# Patient Record
Sex: Male | Born: 1965 | Race: White | Hispanic: No | Marital: Married | Smoking: Former smoker
Health system: Southern US, Community
[De-identification: ages and names within clinical notes are randomized; demographics above are authoritative.]

## PROBLEM LIST (undated history)

## (undated) DIAGNOSIS — G629 Polyneuropathy, unspecified: Secondary | ICD-10-CM

## (undated) DIAGNOSIS — I10 Essential (primary) hypertension: Secondary | ICD-10-CM

## (undated) DIAGNOSIS — K859 Acute pancreatitis without necrosis or infection, unspecified: Secondary | ICD-10-CM

## (undated) DIAGNOSIS — I2510 Atherosclerotic heart disease of native coronary artery without angina pectoris: Secondary | ICD-10-CM

## (undated) DIAGNOSIS — E785 Hyperlipidemia, unspecified: Secondary | ICD-10-CM

## (undated) HISTORY — DX: Atherosclerotic heart disease of native coronary artery without angina pectoris: I25.10

## (undated) HISTORY — DX: Essential (primary) hypertension: I10

## (undated) HISTORY — DX: Hyperlipidemia, unspecified: E78.5

## (undated) HISTORY — DX: Polyneuropathy, unspecified: G62.9

---

## 1990-07-20 HISTORY — PX: LAMINECTOMY: SHX219

## 2000-12-22 ENCOUNTER — Encounter: Payer: Self-pay | Admitting: Emergency Medicine

## 2000-12-22 ENCOUNTER — Emergency Department (HOSPITAL_COMMUNITY): Admission: EM | Admit: 2000-12-22 | Discharge: 2000-12-23 | Payer: Self-pay | Admitting: Emergency Medicine

## 2004-03-25 ENCOUNTER — Emergency Department (HOSPITAL_COMMUNITY): Admission: EM | Admit: 2004-03-25 | Discharge: 2004-03-25 | Payer: Self-pay | Admitting: Family Medicine

## 2007-03-21 HISTORY — PX: CORONARY ARTERY BYPASS GRAFT: SHX141

## 2007-04-13 ENCOUNTER — Inpatient Hospital Stay (HOSPITAL_COMMUNITY): Admission: AD | Admit: 2007-04-13 | Discharge: 2007-04-19 | Payer: Self-pay | Admitting: Cardiovascular Disease

## 2007-04-13 ENCOUNTER — Ambulatory Visit: Payer: Self-pay | Admitting: Cardiovascular Disease

## 2007-04-13 ENCOUNTER — Encounter: Payer: Self-pay | Admitting: Emergency Medicine

## 2007-04-14 ENCOUNTER — Ambulatory Visit: Payer: Self-pay | Admitting: Thoracic Surgery (Cardiothoracic Vascular Surgery)

## 2007-04-27 ENCOUNTER — Inpatient Hospital Stay (HOSPITAL_COMMUNITY): Admission: EM | Admit: 2007-04-27 | Discharge: 2007-04-30 | Payer: Self-pay | Admitting: Emergency Medicine

## 2007-04-27 ENCOUNTER — Ambulatory Visit: Payer: Self-pay | Admitting: *Deleted

## 2007-05-09 ENCOUNTER — Ambulatory Visit: Payer: Self-pay | Admitting: Thoracic Surgery (Cardiothoracic Vascular Surgery)

## 2007-05-09 ENCOUNTER — Encounter
Admission: RE | Admit: 2007-05-09 | Discharge: 2007-05-09 | Payer: Self-pay | Admitting: Thoracic Surgery (Cardiothoracic Vascular Surgery)

## 2007-05-18 ENCOUNTER — Ambulatory Visit: Payer: Self-pay | Admitting: Cardiology

## 2007-05-20 ENCOUNTER — Ambulatory Visit: Payer: Self-pay | Admitting: Cardiology

## 2007-05-20 ENCOUNTER — Ambulatory Visit (HOSPITAL_COMMUNITY)
Admission: RE | Admit: 2007-05-20 | Discharge: 2007-05-20 | Payer: Self-pay | Admitting: Thoracic Surgery (Cardiothoracic Vascular Surgery)

## 2007-05-20 ENCOUNTER — Ambulatory Visit: Payer: Self-pay

## 2007-05-20 LAB — CONVERTED CEMR LAB
ALT: 30 U/L
AST: 21 U/L
Albumin: 4.2 g/dL
Alkaline Phosphatase: 85 U/L
BUN: 15 mg/dL
Bilirubin, Direct: 0.1 mg/dL
CO2: 29 meq/L
Calcium: 9.9 mg/dL
Chloride: 104 meq/L
Cholesterol: 145 mg/dL
Creatinine, Ser: 1.1 mg/dL
Direct LDL: 79.8 mg/dL
GFR calc Af Amer: 94 mL/min
GFR calc non Af Amer: 78 mL/min
Glucose, Bld: 107 mg/dL — ABNORMAL HIGH
HDL: 22.1 mg/dL — ABNORMAL LOW
Potassium: 4.6 meq/L
Sodium: 140 meq/L
Total Bilirubin: 0.5 mg/dL
Total CHOL/HDL Ratio: 6.6
Total Protein: 7.6 g/dL
Triglycerides: 334 mg/dL
VLDL: 67 mg/dL — ABNORMAL HIGH

## 2007-07-05 ENCOUNTER — Ambulatory Visit: Payer: Self-pay

## 2007-07-05 LAB — CONVERTED CEMR LAB
ALT: 24 units/L (ref 0–53)
Bilirubin, Direct: 0.1 mg/dL (ref 0.0–0.3)
Cholesterol: 154 mg/dL (ref 0–200)
HDL: 23.1 mg/dL — ABNORMAL LOW (ref >39.0)
Total CHOL/HDL Ratio: 6.7
Triglycerides: 282 mg/dL (ref 0–149)
VLDL: 56 mg/dL — ABNORMAL HIGH (ref 0–40)

## 2007-07-18 ENCOUNTER — Ambulatory Visit: Payer: Self-pay | Admitting: Cardiology

## 2007-08-07 ENCOUNTER — Emergency Department (HOSPITAL_COMMUNITY): Admission: EM | Admit: 2007-08-07 | Discharge: 2007-08-07 | Payer: Self-pay | Admitting: Family Medicine

## 2007-08-09 ENCOUNTER — Ambulatory Visit: Payer: Self-pay | Admitting: Cardiology

## 2007-08-16 ENCOUNTER — Ambulatory Visit: Payer: Self-pay | Admitting: Cardiology

## 2007-08-16 LAB — CONVERTED CEMR LAB
Bilirubin, Direct: 0.1 mg/dL (ref 0.0–0.3)
Total Bilirubin: 0.7 mg/dL (ref 0.3–1.2)

## 2007-08-18 LAB — CONVERTED CEMR LAB
ALT: 34 units/L (ref 0–53)
AST: 34 units/L (ref 0–37)
BUN: 14 mg/dL (ref 6–23)
Bilirubin, Direct: 0.1 mg/dL (ref 0.0–0.3)
Bilirubin, Direct: 0.1 mg/dL (ref 0.0–0.3)
CO2: 20 meq/L (ref 19–32)
Calcium: 9.9 mg/dL (ref 8.4–10.5)
Calcium: 9.9 mg/dL (ref 8.4–10.5)
Cholesterol: 133 mg/dL (ref 0–200)
Cholesterol: 133 mg/dL (ref 0–200)
Creatinine, Ser: 1.2 mg/dL (ref 0.4–1.5)
Direct LDL: 57.8 mg/dL
Direct LDL: 57.8 mg/dL
GFR calc Af Amer: 85 mL/min
HDL: 25 mg/dL — ABNORMAL LOW (ref >39.0)
HDL: 25 mg/dL — ABNORMAL LOW (ref >39.0)
Total Bilirubin: 0.7 mg/dL (ref 0.3–1.2)
Total Bilirubin: 0.7 mg/dL (ref 0.3–1.2)
Total CHOL/HDL Ratio: 5.3
Total Protein: 7.7 g/dL (ref 6.0–8.3)
Triglycerides: 337 mg/dL (ref 0–149)
Triglycerides: 337 mg/dL (ref 0–149)
VLDL: 67 mg/dL — ABNORMAL HIGH (ref 0–40)

## 2007-08-22 ENCOUNTER — Ambulatory Visit: Payer: Self-pay | Admitting: Internal Medicine

## 2007-09-28 ENCOUNTER — Ambulatory Visit: Payer: Self-pay | Admitting: Cardiology

## 2007-10-03 ENCOUNTER — Ambulatory Visit: Payer: Self-pay | Admitting: Internal Medicine

## 2007-10-03 LAB — CONVERTED CEMR LAB
ALT: 31 units/L (ref 0–53)
AST: 27 units/L (ref 0–37)
BUN: 14 mg/dL (ref 6–23)
Bilirubin, Direct: 0.1 mg/dL (ref 0.0–0.3)
Chloride: 107 meq/L (ref 96–112)
Creatinine, Ser: 1.2 mg/dL (ref 0.4–1.5)
Glucose, Bld: 110 mg/dL — ABNORMAL HIGH (ref 70–99)
HDL: 23.2 mg/dL — ABNORMAL LOW (ref >39.0)
Total Bilirubin: 0.5 mg/dL (ref 0.3–1.2)
Total CHOL/HDL Ratio: 5.5
Total Protein: 7.1 g/dL (ref 6.0–8.3)
Triglycerides: 247 mg/dL (ref 0–149)

## 2007-11-24 ENCOUNTER — Ambulatory Visit: Payer: Self-pay | Admitting: Internal Medicine

## 2007-11-24 LAB — CONVERTED CEMR LAB
Cholesterol: 117 mg/dL (ref 0–200)
HDL: 20.7 mg/dL — ABNORMAL LOW (ref >39.0)
LDL Cholesterol: 58 mg/dL (ref 0–99)
Total CHOL/HDL Ratio: 5.7

## 2007-12-01 ENCOUNTER — Ambulatory Visit: Payer: Self-pay | Admitting: Internal Medicine

## 2008-01-31 ENCOUNTER — Ambulatory Visit: Payer: Self-pay | Admitting: Cardiology

## 2008-01-31 LAB — CONVERTED CEMR LAB
AST: 24 units/L (ref 0–37)
Cholesterol: 120 mg/dL (ref 0–200)
Direct LDL: 61.8 mg/dL
HDL: 22.1 mg/dL — ABNORMAL LOW (ref >39.0)
Total Protein: 7.8 g/dL (ref 6.0–8.3)

## 2008-02-02 ENCOUNTER — Ambulatory Visit: Payer: Self-pay | Admitting: Cardiovascular Disease

## 2008-03-08 ENCOUNTER — Emergency Department (HOSPITAL_COMMUNITY): Admission: EM | Admit: 2008-03-08 | Discharge: 2008-03-08 | Payer: Self-pay | Admitting: Emergency Medicine

## 2008-04-05 ENCOUNTER — Ambulatory Visit: Payer: Self-pay | Admitting: Cardiology

## 2008-04-05 LAB — CONVERTED CEMR LAB
ALT: 26 units/L (ref 0–53)
AST: 23 units/L (ref 0–37)
Albumin: 4.1 g/dL (ref 3.5–5.2)
Alkaline Phosphatase: 82 units/L (ref 39–117)
Cholesterol: 198 mg/dL (ref 0–200)
Direct LDL: 134 mg/dL
Total CHOL/HDL Ratio: 6.7
Total Protein: 7.6 g/dL (ref 6.0–8.3)

## 2008-04-12 ENCOUNTER — Ambulatory Visit: Payer: Self-pay | Admitting: Cardiology

## 2008-04-18 ENCOUNTER — Ambulatory Visit: Payer: Self-pay | Admitting: Cardiology

## 2008-04-24 DIAGNOSIS — I10 Essential (primary) hypertension: Secondary | ICD-10-CM | POA: Insufficient documentation

## 2008-04-24 DIAGNOSIS — E785 Hyperlipidemia, unspecified: Secondary | ICD-10-CM

## 2008-04-24 DIAGNOSIS — I2510 Atherosclerotic heart disease of native coronary artery without angina pectoris: Secondary | ICD-10-CM | POA: Insufficient documentation

## 2008-07-24 ENCOUNTER — Ambulatory Visit: Payer: Self-pay | Admitting: Cardiology

## 2008-07-24 LAB — CONVERTED CEMR LAB
ALT: 29 units/L (ref 0–53)
AST: 25 units/L (ref 0–37)
Alkaline Phosphatase: 61 units/L (ref 39–117)
Bilirubin, Direct: 0.1 mg/dL (ref 0.0–0.3)
Cholesterol: 120 mg/dL (ref 0–200)
LDL Cholesterol: 60 mg/dL (ref 0–99)
Total CHOL/HDL Ratio: 4.8
Total Protein: 7.6 g/dL (ref 6.0–8.3)
Triglycerides: 177 mg/dL — ABNORMAL HIGH (ref 0–149)

## 2008-07-26 ENCOUNTER — Ambulatory Visit: Payer: Self-pay | Admitting: Cardiology

## 2008-10-31 ENCOUNTER — Ambulatory Visit: Payer: Self-pay | Admitting: Cardiology

## 2008-10-31 LAB — CONVERTED CEMR LAB
ALT: 36 units/L (ref 0–53)
Albumin: 4.6 g/dL (ref 3.5–5.2)
Alkaline Phosphatase: 78 units/L (ref 39–117)
Bilirubin, Direct: 0.4 mg/dL — ABNORMAL HIGH (ref 0.0–0.3)
HDL: 23.2 mg/dL — ABNORMAL LOW (ref >39.00)
VLDL: 26.4 mg/dL (ref 0.0–40.0)

## 2008-11-01 ENCOUNTER — Ambulatory Visit: Payer: Self-pay | Admitting: Internal Medicine

## 2008-11-02 ENCOUNTER — Encounter: Payer: Self-pay | Admitting: Cardiology

## 2008-12-20 ENCOUNTER — Ambulatory Visit: Payer: Self-pay | Admitting: Cardiology

## 2008-12-21 ENCOUNTER — Telehealth: Payer: Self-pay | Admitting: Cardiology

## 2008-12-21 LAB — CONVERTED CEMR LAB
Total Protein: 7.7 g/dL (ref 6.0–8.3)
VLDL: 46.8 mg/dL — ABNORMAL HIGH (ref 0.0–40.0)

## 2008-12-27 ENCOUNTER — Ambulatory Visit: Payer: Self-pay | Admitting: Cardiology

## 2009-02-05 ENCOUNTER — Encounter (INDEPENDENT_AMBULATORY_CARE_PROVIDER_SITE_OTHER): Payer: Self-pay | Admitting: *Deleted

## 2009-02-08 ENCOUNTER — Ambulatory Visit: Payer: Self-pay | Admitting: Cardiovascular Disease

## 2009-02-11 ENCOUNTER — Encounter (INDEPENDENT_AMBULATORY_CARE_PROVIDER_SITE_OTHER): Payer: Self-pay | Admitting: *Deleted

## 2009-02-11 LAB — CONVERTED CEMR LAB
ALT: 24 units/L (ref 0–53)
Bilirubin, Direct: 0 mg/dL (ref 0.0–0.3)
Direct LDL: 61.4 mg/dL
HDL: 24.5 mg/dL — ABNORMAL LOW (ref >39.00)
Total Bilirubin: 0.7 mg/dL (ref 0.3–1.2)
Total Protein: 7.6 g/dL (ref 6.0–8.3)

## 2009-09-12 ENCOUNTER — Ambulatory Visit: Payer: Self-pay | Admitting: Cardiology

## 2009-09-17 ENCOUNTER — Telehealth: Payer: Self-pay | Admitting: Cardiovascular Disease

## 2009-09-17 ENCOUNTER — Encounter (INDEPENDENT_AMBULATORY_CARE_PROVIDER_SITE_OTHER): Payer: Self-pay | Admitting: Cardiology

## 2009-09-17 LAB — CONVERTED CEMR LAB
ALT: 26 units/L (ref 0–53)
AST: 23 units/L (ref 0–37)
Alkaline Phosphatase: 61 units/L (ref 39–117)
BUN: 12 mg/dL (ref 6–23)
Bilirubin, Direct: 0 mg/dL (ref 0.0–0.3)
CO2: 27 meq/L (ref 19–32)
Calcium: 9.5 mg/dL (ref 8.4–10.5)
Chloride: 110 meq/L (ref 96–112)
Direct LDL: 108.8 mg/dL
Sodium: 142 meq/L (ref 135–145)
Total Bilirubin: 0.1 mg/dL — ABNORMAL LOW (ref 0.3–1.2)

## 2009-09-18 ENCOUNTER — Encounter: Payer: Self-pay | Admitting: Cardiology

## 2009-09-18 ENCOUNTER — Ambulatory Visit: Payer: Self-pay | Admitting: Cardiology

## 2009-10-18 ENCOUNTER — Ambulatory Visit: Payer: Self-pay | Admitting: Diagnostic Radiology

## 2009-10-18 ENCOUNTER — Emergency Department (HOSPITAL_BASED_OUTPATIENT_CLINIC_OR_DEPARTMENT_OTHER): Admission: EM | Admit: 2009-10-18 | Discharge: 2009-10-18 | Payer: Self-pay | Admitting: Emergency Medicine

## 2010-08-21 NOTE — Letter (Signed)
Summary: [facility] - Lipid  [facility], Main Office  [address]   [place], [place] [address]   Phone: [phone]  Fax: [phone]     September 17, 2009 MRN: [id number]   [name] [name] [address] [place], [place]  [address]   Dear [name],  We have reviewed your cholesterol results.  They are as follows:     Total Cholesterol:    200 (Desirable: less than 200)       HDL  Cholesterol:     30.60  (Desirable: greater than 40 for men and 50 for women)       LDL Cholesterol:       82  (Desirable: less than 100 for low risk and less than 70 for moderate to high risk)       Triglycerides:       551.0  (Desirable: less than 150)  Our recommendations include:   Call our office at the number listed above if you have any questions.  Lowering your LDL cholesterol is important, but it is only one of a large number of "risk factors" that may indicate that you are at risk for heart disease, stroke or other complications of hardening of the arteries.  Other risk factors include:   A.  Cigarette Smoking* B.  High Blood Pressure* C.  Obesity* D.   Low HDL Cholesterol (see yours above)* E.   Diabetes Mellitus (higher risk if your is uncontrolled) F.  Family history of premature heart disease G.  Previous history of stroke or cardiovascular disease    *These are risk factors YOU HAVE CONTROL OVER.  For more information, visit .  There is now evidence that lowering the TOTAL CHOLESTEROL AND LDL CHOLESTEROL can reduce the risk of heart disease.  The American Heart Association recommends the following guidelines for the treatment of elevated cholesterol:  1.  If there is now current heart disease and less than two risk factors, TOTAL CHOLESTEROL should be less than 200 and LDL CHOLESTEROL should be less than 100. 2.  If there is current heart disease or two or more risk factors, TOTAL CHOLESTEROL should be less than 200 and LDL CHOLESTEROL should be less than 70.  A diet  low in cholesterol, saturated fat, and calories is the cornerstone of treatment for elevated cholesterol.  Cessation of smoking and exercise are also important in the management of elevated cholesterol and preventing vascular disease.  Studies have shown that 30 to 60 minutes of physical activity most days can help lower blood pressure, lower cholesterol, and keep your weight at a healthy level.  Drug therapy is used when cholesterol levels do not respond to therapeutic lifestyle changes (smoking cessation, diet, and exercise) and remains unacceptably high.  If medication is started, it is important to have you levels checked periodically to evaluate the need for further treatment options.  Thank you,    [employer] Team

## 2010-08-21 NOTE — Assessment & Plan Note (Signed)
Summary: [facility]   Visit Type:  1 year follow up  CC:  Left foot pain.  History of Present Illness: Mr. Cauthon is a  gentleman who has a history of coronary artery disease status post coronary artery bypassing graft in September 2008.  In October 2008, he had recurrent chest pain and positive enzymes.  Her followup catheterization revealed that his saphenous vein graft to the diagonal was occluded.  I last saw him in September of 2009. Since then the patient denies any dyspnea on exertion, orthopnea, PND, pedal edema, palpitations, syncope or chest pain.   Current Medications (verified): 1)  Aspirin 81 Mg Tbec (Aspirin) .... Take One Tablet By Mouth Daily 2)  Gabapentin 300 Mg Caps (Gabapentin) .... Take 4 Capsules Two Times A Day 3)  Fish Oil 1000 Mg Caps (Omega-3 Fatty Acids) .... Take 1 Capsule Two Times A Day 4)  Multivitamins   Tabs (Multiple Vitamin) .... Take 1 Tablet Daily 5)  Garlic   Powd (Garlic) .... Take 1 Tablet Daily 6)  Lisinopril 5 Mg Tabs (Lisinopril) .... Take One Tablet By Mouth Daily 7)  Crestor 20 Mg Tabs (Rosuvastatin Calcium) .Marland Kitchen.. 1 Tab Daily  Allergies (verified): No Known Drug Allergies  Past History:  Past Medical History: Reviewed history from 04/24/2008 and no changes required. CAD Hyperlipidemia Peripheral neuropathy Hypertension  Past Surgical History: CABG(September 2008) - left internal mammary artery to left anterior   descending artery, free right internal mammary artery to obtuse marginal   #1, saphenous vein graft to first diagonal, saphenous vein graft   sequentially to acute marginal and posterior descending Laminectomy in  L5-S1 in 1992  Social History: Reviewed history from 04/24/2008 and no changes required. Married  Tobacco Use - Former.   Review of Systems       Left Foot Pain but no fevers or chills, productive cough, hemoptysis, dysphasia, odynophagia, melena, hematochezia, dysuria, hematuria, rash, seizure  activity, orthopnea, PND, pedal edema, claudication. Remaining systems are negative.   Vital Signs:  Patient profile:   45 year old male Height:      70 inches Weight:      209.25 pounds BMI:     30.13 Pulse rate:   73 / minute Resp:     18 per minute BP sitting:   132 / 80  (left arm) Cuff size:   large  Vitals Entered By: Vikki Ports (September 18, 2009 2:27 PM)  Physical Exam  General:  Well-developed well-nourished in no acute distress.  Skin is warm and dry.  HEENT is normal.  Neck is supple. No thyromegaly.  Chest is clear to auscultation with normal expansion.  Cardiovascular exam is regular rate and rhythm.  Abdominal exam nontender or distended. No masses palpated. Extremities show no edema. neuro grossly intact    EKG  Procedure date:  09/18/2009  Findings:      Sinus rhythm at a rate of 70. Axis normal. No ST changes.  Impression & Recommendations:  Problem # 1:  CAD, NATIVE VESSEL (ICD-414.01)  Continue aspirin, ACE inhibitor and statin. Continue risk factor modification. Note he denies any recurrent tobacco use. The following medications were removed from the medication list:    Lisinopril 10 Mg Tabs (Lisinopril) .Marland Kitchen... Take 1 tablet by mouth once a day    Plavix 75 Mg Tabs (Clopidogrel bisulfate) .Marland Kitchen... Take 1 tablet by mouth once a day His updated medication list for this problem includes:    Aspirin 81 Mg Tbec (Aspirin) .Marland Kitchen... Take  one tablet by mouth daily    Lisinopril 5 Mg Tabs (Lisinopril) .Marland Kitchen... Take one tablet by mouth daily  The following medications were removed from the medication list:    Lisinopril 10 Mg Tabs (Lisinopril) .Marland Kitchen... Take 1 tablet by mouth once a day    Plavix 75 Mg Tabs (Clopidogrel bisulfate) .Marland Kitchen... Take 1 tablet by mouth once a day His updated medication list for this problem includes:    Aspirin 81 Mg Tbec (Aspirin) .Marland Kitchen... Take one tablet by mouth daily    Lisinopril 5 Mg Tabs (Lisinopril) .Marland Kitchen... Take one tablet by mouth  daily  Problem # 2:  HYPERTENSION, BENIGN (ICD-401.1)  Blood pressure controlled on present medications. Will continue. Check bmet in 6 months. The following medications were removed from the medication list:    Lisinopril 10 Mg Tabs (Lisinopril) .Marland Kitchen... Take 1 tablet by mouth once a day His updated medication list for this problem includes:    Aspirin 81 Mg Tbec (Aspirin) .Marland Kitchen... Take one tablet by mouth daily    Lisinopril 5 Mg Tabs (Lisinopril) .Marland Kitchen... Take one tablet by mouth daily  The following medications were removed from the medication list:    Lisinopril 10 Mg Tabs (Lisinopril) .Marland Kitchen... Take 1 tablet by mouth once a day His updated medication list for this problem includes:    Aspirin 81 Mg Tbec (Aspirin) .Marland Kitchen... Take one tablet by mouth daily    Lisinopril 5 Mg Tabs (Lisinopril) .Marland Kitchen... Take one tablet by mouth daily  Problem # 3:  HYPERLIPIDEMIA-MIXED (ICD-272.4) Continue present medications. Followup lipids and liver in 6 months. The following medications were removed from the medication list:    Slo-niacin 500 Mg Cr-tabs (Niacin) .Marland Kitchen... Take 1 tablet two times a day    Niaspan 1000 Mg Cr-tabs (Niacin (antihyperlipidemic)) .Marland Kitchen... Take 1 tablet by mouth every night His updated medication list for this problem includes:    Crestor 20 Mg Tabs (Rosuvastatin calcium) .Marland Kitchen... 1 tab daily  The following medications were removed from the medication list:    Slo-niacin 500 Mg Cr-tabs (Niacin) .Marland Kitchen... Take 1 tablet two times a day    Niaspan 1000 Mg Cr-tabs (Niacin (antihyperlipidemic)) .Marland Kitchen... Take 1 tablet by mouth every night His updated medication list for this problem includes:    Crestor 20 Mg Tabs (Rosuvastatin calcium) .Marland Kitchen... 1 tab daily  Patient Instructions: 1)  Your physician recommends that you schedule a follow-up appointment in: ONE YEAR 2)  Your physician recommends that you return for a FASTING lipid profile: IN 6 MONTHS-SEPTEMBER

## 2010-08-21 NOTE — Progress Notes (Signed)
Summary: test results  Phone Note Call from Patient Call back at Home Phone [phone]   Caller: Spouse Reason for Call: Talk to Nurse, Lab or Test Results Initial call taken by: Lorne Skeens,  September 17, 2009 2:52 PM  Follow-up for Phone Call        Called at 323 pm 09/17/2009..Called patient and left message on machine to return call to discuss lipid labs.  Expect pt d/cd one or more meds...have mailed copy to patient home as well..mp Follow-up by: Shelby Dubin PharmD, BCPS, CPP,  September 17, 2009 3:25 PM

## 2010-12-02 NOTE — Letter (Signed)
May 09, 2007   Madolyn Frieze. [name], MD, [facility]  [address]  [place], [place].   Re:  [name], [name]                DOB:  07/17/1966   Dear Arlys John,   I saw [name] [name] back in the office today. As you know, he is a very  nice 45 year old gentleman, who was admitted with unstable angina in  September. He had coronary artery bypass grafting x five done on an  urgent basis. Postoperatively, initially his course was uncomplicated,  but I found out today that he was readmitted with a nonST elevation MI  about a week after he had been first discharged. At that time, he was  found to have occlusion of his saphenous vein graft to a small diagonal  branch of the LAD.  He has done well since that event and currently is  very physically active without any anginal-type discomfort. I have  enclosed a copy of my office notes for your records.   Thank you very much for allowing me to see [name] [name] and participate in  his care. Please do not hesitate to contact me at any point where I  could be of further assistance.   Sincerely,   Salvatore Decent. Dorris Fetch, M.D.   Salvatore Decent Dorris Fetch, M.D.  Electronically Signed   SCH/MEDQ  D:  05/09/2007  T:  05/10/2007  Job:  [id number]

## 2010-12-02 NOTE — Assessment & Plan Note (Signed)
[facility]                               LIPID CLINIC NOTE   NAME:Swindler, ERYN KREJCI                       MRN:          [id number]  DATE:07/26/2008                            DOB:          09-11-1965    Mr. [name] [name] is seen in the Lipid Clinic for further evaluation and  medication titration associated with his hyperlipidemia in the setting  of documented coronary artery disease.  He has been feeling and doing  well overall.  He has been using simvastatin 80 mg daily as a substitute  for Lipitor 40 mg.  He continues on his Slo-Niacin of 1000 mg a day and  his aspirin 81 mg daily.  He has continued to exhibit a good dietary  control and has continued to avoid sweet tea.   PAST MEDICAL HISTORY:  Pertinent for documented coronary artery disease.   CURRENT MEDICATIONS:  1. Slo-Niacin 1000 mg daily.  2. Aspirin 81 mg daily.  3. Plavix 75 mg daily.  4. Neurontin 900 mg twice daily.  5. Fish oil 1200 mg in the morning and 400 mg at night.  6. Multivitamin daily.  7. Simvastatin 80 mg daily.  8. Lisinopril 10 mg daily.  9. Garlic daily.  10.Potassium daily.  11.Co Q10 daily.   DRUG ALLERGIES:  None related to current medications.   LABORATORY DATA:  Her labs reveal normal LFTs.  Total cholesterol 120,  triglycerides 177, LDL 60, and HDL of 24.9.   ASSESSMENT:  The patient has made good improvement in his overall panel.  He will continue to improve his dietary therapy.  He will call with  questions or problems.  In the meantime, he has  been warned about muscle aches, pains, weakness, fatigue, or other  problems and will call, if these symptoms developed.      Shelby Dubin, PharmD, BCPS, CPP  Electronically Signed      Madolyn Frieze. [name], MD, [facility]  Electronically Signed   MP/MedQ  DD: 08/13/2008  DT: 08/13/2008  Job #: [id number]   cc:   Madolyn Frieze. [name], MD, [facility]

## 2010-12-02 NOTE — Assessment & Plan Note (Signed)
[facility]                               LIPID CLINIC NOTE   NAME:[name] [name], [name] [name]                       MRN:          [id number]  DATE:12/01/2007                            DOB:          June 21, 1966    REFERRING PHYSICIAN:  Madolyn Frieze. [name], MD, [facility]   HISTORY:  [name] [name] is seen in the lipid clinic today for further  evaluation and medication titration associated with his significant  coronary history and history of coronary vascular and peripheral  vascular disease as well as for his hyperlipidemia and  hypertriglyceridemia.  [name] [name] has continued to work on his diet.  He  has completely cut out sweets.  He has cut out white breads and  starches.  He has increased his physical activity to 30 minutes of  walking each day even when he is working which means he has been walking  on average of 25-26 days each month.  He has been feeling well since  discontinuing his Trilipix.  He has had no further muscle aches, pains,  weakness, fatigue or cramping in his lower extremities outside of what  he normally experiences with his history of peripheral neuropathy.   PAST MEDICAL HISTORY:  Includes documented coronary disease in September  2008.  Coronary artery bypass grafting October 2008.  Saphenous vein  graft to the diagonal was occluded.  Since that time, he has been  feeling well.   CURRENT MEDICATIONS:  1. Aspirin 325 mg daily.  2. Plavix 75 mg daily with planned use for 1 year from October 2008.  3. Niaspan 1 gm daily at bedtime.  4. Neurontin 600 mg twice daily.  5. Fish oil 1000 mg 3 times daily.  6. Multivitamin daily.  7. Lipitor 40 mg daily.  8. Lisinopril 10 mg daily.   REVIEW OF SYSTEMS:  As stated in the HPI, otherwise negative.   PHYSICAL EXAMINATION:  VITAL SIGNS:  Weight today in the office is 212-  3/4 pounds down from 220 pounds last month.  Blood pressure is 120/68,  respirations are 16.   LABORATORY DATA:  From Nov 24, 2007:   Normal LFTs.  Total cholesterol  117, triglycerides 190, HDL 20.7, LDL 58.   ASSESSMENT:  [name] [name] has made outstanding progress with dietary  control, exercise therapy and a combination of lipid lower therapy that  now include niacin, Lipitor 40 mg daily and fish oil 3 gm daily.  I have  provided congratulations to the patient that he continue on these good  habits for now.  I have provided Lipitor samples to the patient to  facilitate compliance.  He has agreed continue with his physical  activity.  He is working towards losing a little bit more weight which I  have said it is okay within reason.  He will contact with questions or  problems in the meantime.  He is undergoing lots of family stress at  home.  He and his wife are potentially in the processes of adopting his  46-year-old niece.  He has agreed to let us know if we  need to change  appointments due to outside constraints.  Followup is scheduled in 8  weeks with lipid and liver labs prior to that visit.      Shelby Dubin, PharmD, BCPS, CPP  Electronically Signed      Madolyn Frieze. [name], MD, [facility]  Electronically Signed   MP/MedQ  DD: 12/01/2007  DT: 12/01/2007  Job #: [id number]   cc:   Madolyn Frieze. [name], MD, [facility]

## 2010-12-02 NOTE — Cardiovascular Report (Signed)
NAMELAMARION, MCEVERS                ACCOUNT NO.:  [id number]   MEDICAL RECORD NO.:  [id number]          PATIENT TYPE:  INP   LOCATION:  2308                         FACILITY:  [facility]   PHYSICIAN:  Bruce R. Juanda Chance, MD, FACCDATE OF BIRTH:  June 16, 1966   DATE OF PROCEDURE:  04/14/2007  DATE OF DISCHARGE:                            CARDIAC CATHETERIZATION   CLINICAL HISTORY:  Mr. 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  He is in  Holiday representative work and has prior history of known heart disease.  He is a  smoker.  He recently has developed symptoms of exertional fatigue and  dyspnea and was admitted with chest pain and positive enzymes consistent  with a non-ST-elevation myocardial infarction.  He was seen by Dr.  Eden Emms and scheduled for cardiac catheterization.   PROCEDURE:  The procedure was performed via right femoral artery using  arterial sheath and 6-French preformed coronary catheters.  A front wall  arterial puncture was performed and Omnipaque contrast was used.  Distal  aortogram was performed to rule out an abdominal aortic aneurysm.  The  patient tolerated the procedure well and left the laboratory in  satisfactory condition.   RESULTS:  The left main coronary was free of significant disease.   Left anterior descending artery gave rise to two diagonal branches and a  septal perforator.  There was 80% narrowing in the mid LAD.  There was  90 percent narrowing in the first diagonal branch and multiple 90-80%  stenoses in the second diagonal branch.   The circumflex artery was a moderate size vessel that gave rise to a  large marginal branch and a posterolateral branch.  There was 95%  stenosis in the proximal circumflex artery.  There was 70% narrowing in  the marginal branch.   The right coronary artery was completely occluded about thirds the way  down its course just after a right ventricular branch.  There  was 80%  ostial stenosis and 90% stenosis in the proximal and 80% stenosis in the  midportion.  The distal vessel consists of posterior descending and a  posterolateral branch which filled via collaterals from an atrial branch  of the right coronary and also via collaterals from the left coronary  system.   Left ventriculogram:  The left ventriculogram from the RAO projection  showed good wall motion with no areas of hypokinesis.  Estimated  ejection fraction was 60%.   Distal aortogram:  A distal aortogram was performed which showed patent  renal arteries and no significant aortoiliac obstruction.   The aortic pressure was 103/67, mean of 84.  Left ventricular pressure  was 103/16.   CONCLUSION:  Severe three-vessel coronary artery disease with 80%  stenosis in the mid left anterior descending artery and 90% stenosis in  the first and second diagonal branches of the LAD, 95% stenosis of  proximal circumflex artery and 70% stenosis in the marginal branch of  circumflex artery and total occlusion of the mid-to-distal right  coronary with normal  LV function.   RECOMMENDATIONS:  The patient has severe three-vessel disease which I  think is best revascularized with surgical therapy.  Surgical  consultation has been obtained.  Will plan to leave the sheath in place  until surgical consultation is completed.  The patient has Integrilin  running and has not been on Plavix.      Bruce Elvera Lennox Juanda Chance, MD, [facility]  Electronically Signed     BRB/MEDQ  D:  04/14/2007  T:  04/15/2007  Job:  [id number]   cc:   Noralyn Pick. Eden Emms, MD, [facility]  Cardiopulmonary Lab

## 2010-12-02 NOTE — Assessment & Plan Note (Signed)
OFFICE VISIT   [name] [name], [name] [name]  DOB:  Jan 06, 1966                                        May 09, 2007  CHART #:  [id number]   SUBJECTIVE:  Patient presents today for his postoperative followup.  He  is status post CABG x5 on April 14, 2007.  After a relatively  uneventful postoperative course, he was discharged home on September  30th.  He progressed well for approximately 10 days postoperatively  until around October 8th.  At that time, he developed acute onset of  chest discomfort while doing some housework.  He was brought back to  [facility] by EMS for evaluation.  He was seen in the ER and was found  to have some elevation of his cardiac enzymes with no significant EKG  changes.  His pain was relieved with morphine, and he was subsequently  admitted by cardiology for continued workup.  He had a cardiac  catheterization by Dr. Antoine Poche on April 27, 2007, which showed patent  graft to the LAD, the obtuse marginal on the PDA and acute marginal  branch of the right coronary artery.  He did have occlusion of the  saphenous vein graft to the first diagonal, and an attempt was made to  percutaneously open this graft, which was unsuccessful.  He was started  on Plavix as well as lisinopril and was given Xanax for some anxiety.  He remained stable and painful and was able to be discharged on April 30, 2007.  Since that time, he has remained stable at home, and he  continues to progress well.  He reports having made significant changes  in his diet and has completely quit smoking.  He is progressing with  ambulation, and his pain is controlled with plain Tylenol at this point.   OBJECTIVE:  Vital signs today:  Blood pressure 113/73, pulse 80,  respirations 18, O2 sat 99% on room air.  His incisions are all healing  well.  His sternum is stable without clicks.  Lungs:  Clear.  Heart:  Regular rate and rhythm without murmurs, rubs or gallops.   Extremities:  Without significant edema.   Chest x-ray shows minimal atelectasis and effusions bilaterally.   ASSESSMENT/PLAN:  Patient is status post coronary artery bypass graft x5  and is progressing well.  He was seen and evaluated by Dr. Dorris Fetch  and has been released at this point.  He may start driving and is asked  to continue with sterile precautions, including no heavy lifting for  three more weeks.  He has a followup scheduled with Dr. [name] on  May 18, 2007 and will have blood work at that time.  He will follow  up here as needed.   [name] [name] Dorris Fetch, M.D.  Electronically Signed   GC/MEDQ  D:  05/09/2007  T:  05/10/2007  Job:  [id number]

## 2010-12-02 NOTE — Cardiovascular Report (Signed)
NAMEJHALEN, ELEY                ACCOUNT NO.:  [id number]   MEDICAL RECORD NO.:  [id number]          PATIENT TYPE:  INP   LOCATION:  2020                         FACILITY:  [facility]   PHYSICIAN:  Veverly Fells. Excell Seltzer, MD  DATE OF BIRTH:  19-Nov-1965   DATE OF PROCEDURE:  04/27/2007  DATE OF DISCHARGE:                            CARDIAC CATHETERIZATION   PROCEDURE:  Left heart catheterization, selective coronary angiography,  left ventricular angiography, saphenous vein graft angiography, left  internal mammary artery angiography, attempted percutaneous coronary  intervention of the saphenous vein graft to the first diagonal  (unsuccessful).   INDICATIONS:  Mr. Hilbun is a 45 year old male who just underwent  coronary bypass surgery last month.  He has multivessel disease and  underwent five-vessel bypass.  He presented with atypical chest pain,  but his cardiac enzymes were increasing and he was therefore referred  for cardiac catheterization in the setting of his non-ST-elevation MI.   DESCRIPTION OF PROCEDURE:  Risks and indications of procedure were  reviewed with the patient and informed consent was obtained.  The right  groin was prepped, draped, anesthetized with 1% lidocaine.  Using the  modified Seldinger technique, a 6-French sheath was placed in the right  femoral artery.  Standard 6-French Judkins catheters were used to image  the native coronary arteries.  The JR-4 catheter was used to image the  LIMA to LAD as well as both saphenous vein grafts.  Following selective  coronary angiography, an angled pigtail catheter was inserted in the  left ventricle where pressures were recorded.  Left ventriculogram was  performed and pullback across the aortic valve was done.   Following the diagnostic procedure, I attempted to intervene on the  saphenous vein graft to the first diagonal branch of the LAD.  Mr. Silliman  had patent bypass grafts with the exception of the vein graft to the  first diagonal.  This graft was occluded at the distal coronary  anastomosis and there was dye stain in that vessel.  I suspected that  this was the patient's culprit vessel.  The target vessel appeared  small, but I thought it was worth an attempt to reopen.  Angiomax was  used for anticoagulation.  The patient was preloaded with 600 mg of  clopidogrel.  Sheath was upgraded to a 7-French.  A 7-French, LCB guide  catheter was inserted and a Proxis device was used for proximal embolic  protection.  Once therapeutic ACT was achieved, the Proxis device was  advanced into the bypass graft.  A cougar guidewire was used and I was  able to cross the occlusion and enter the native diagonal after some  difficulty.  I attempted to advance a balloon beyond the occluded area  and a 1.5 x 20-mm balloon was advanced beyond the occlusion into the  native diagonal branch.  Contrast injections were done through the  distal balloon and this demonstrated a tiny diagonal.  The diagonal  appears to be less than 1 mm and I elected not to perform any balloon  inflations as I suspected chances for long-term patency with this  graft  were minimal.  I was also concerned about using any balloon in a vessel  this small.  At that point, the equipment was removed and a final  angiogram demonstrated stable anatomy.  The patient was transferred out  to the holding area in stable condition.   FINDINGS:  Aortic pressure 95/61 with a mean of 76, left ventricular  pressure 95/15.   Native coronary angiography:  The left mainstem is mildly calcified.  It  bifurcates into the LAD and left circumflex.   The LAD is a large-caliber vessel in its proximal aspect.  It is  severely diseased.  After the first septal perforator, there is severe  stenosis of the LAD with diffuse long segment, 80% stenosis.  The mid  and distal portion of the LAD fill competitively from native and LIMA  flow.  There are two diagonal branches from the  LAD.  The first diagonal  is grafted.  It is a diffusely diseased vessel with severe disease  throughout.  The second diagonal is not grafted.  It also has severe  stenosis with serial 90% lesions.   The left circumflex is a large-caliber vessel.  The circumflex courses  down and has a 99% stenosis in its proximal aspect and is totally  occluded in its mid aspect.   The right coronary artery is severely diseased.  It has 90-99% stenosis  in the mid aspect and then is occluded following the area of severe  stenosis.   LIMA angiography demonstrates a widely patent LIMA to mid-LAD.   Saphenous vein graft angiography demonstrates a patent saphenous vein  graft sequenced to the acute marginal branch of the right coronary  artery and the PDA branch of the right coronary artery.  The PDA limb  fills the PL branch in a retrograde fashion.  There is a large vein to  native coronary vessel mismatch as the vein graft is large and ectatic  and the coronary vessel was relatively small.   The saphenous vein graft to the first diagonal branch is occluded.  There is dye stain.  It is occluded at the graft coronary anastomosis.   The free RIMA to the OM branch is widely patent.  There RIMA is  anastomosed to the proximal portion of the vein graft to the diagonal.  It then courses down and supplies the OM branch and also retrograde  fills the left circumflex.  This graft is widely patent and there is no  significant distal disease in the OM branch.   Left ventricular function assessed by ventriculography shows LVEF of  55%.  There is subtle anterolateral hypokinesis.  The overall LVEF is  well-preserved.   ASSESSMENT:  1. Severe native three-vessel coronary artery disease.  2. Patent left internal mammary artery to the left anterior      descending.  3. Patent right internal mammary artery to obtuse marginal.  4. Patent saphenous vein graft sequenced to the posterior descending      artery and  acute marginal branches of the right coronary artery.  5. Occluded saphenous vein graft to the first diagonal.  6. Preserved left ventricular function.  7. Unsuccessful percutaneous coronary intervention of the saphenous      vein graft to the first diagonal.   RECOMMENDATIONS:  Mr. Dahmen should continue with medical therapy.  Overall, he has a good prognosis in the setting of preserved LV function  and widely patent grafts to the major epicardial vessels.  As detailed  above, I  elected to leave the diagonal occlusion without any further  treatment as I was able to confirm that the diagonal branch was  extremely small through a distal balloon injection and I did not feel  that the long-term patency of this graft was feasible.  He should  continue with aspirin.  Clopidogrel was added in the setting of his non-  ST MI and he would benefit from 12 months of therapy if he is able to  obtain Plavix.  He should continue with a statin and other aggressive  secondary risk reduction.     Veverly Fells. Excell Seltzer, MD  Electronically Signed    MDC/MEDQ  D:  04/29/2007  T:  04/30/2007  Job:  [id number]

## 2010-12-02 NOTE — Assessment & Plan Note (Signed)
[facility]                               LIPID CLINIC NOTE   NAME:[name], [name] [name]                       MRN:          [id number]  DATE:02/02/2008                            DOB:          1966-02-18    HISTORY:  The patient is seen in Lipid Clinic for further evaluation,  medication titration associated with his hyperlipidemia in the setting  of documented coronary artery disease.  Since his last visit, the  patient has lost Medicaid coverage.  He has had a period of time where  he has been without his meds, but has gotten some work and is able to  purchase some again.  We have talked about cost effective ways to lessen  his overall prescription cost today.  The patient has been feeling and  doing well overall.  He continues to have periodic pain in his left  foot.  Gabapentin that he has been given has been helping that  dramatically per his report.   PAST MEDICAL HISTORY:  Pertinent for documented coronary artery disease  and hyperlipidemia.   CURRENT MEDICATIONS:  1. Aspirin 325 mg daily.  2. Plavix 75 mg daily.  3. Niaspan 1000 mg daily in the evening (we have discussed changing      this over to Slo-Niacin from a cost perspective).  4. Neurontin 300 mg tablets 2 twice daily.  5. Fish oil 1000 mg 3 times daily.  6. Multivitamins daily.  7. Lipitor 40 mg daily.  8. Lisinopril 10 mg daily.   REVIEW OF SYSTEMS:  As stated in the HPI, otherwise, negative.   PHYSICAL EXAMINATION:  VITAL SIGNS:  Weight today is 214 pounds, blood  pressure is 108/70, heart rate is 60, and respirations are 16.   LABORATORY DATA:  On January 31, 2008, revealed normal LFTs, total  cholesterol 120, triglycerides 212, HDL 22.1, LDL direct 61.8.  The  patient had been off some of his meds for some period of time.   ASSESSMENT:  The patient has documented coronary artery disease and LDL  goal less than 70, which he meets, and non-HDL goal less than 100, which  he  approaches.  The patient will continue on his therapy.  He will call  with questions or problems.  He will discuss with Ms. Enis switching  over from Niaspan to Slo-Niacin over the counter if this may be a more  cost  effective measure.  He will call with questions or problems in the  meantime.  We will see him back in about 6 to 8 weeks.      Shelby Dubin, PharmD, BCPS, CPP  Electronically Signed      Noralyn Pick. Eden Emms, MD, [facility]  Electronically Signed   MP/MedQ  DD: 02/02/2008  DT: 02/03/2008  Job #: [id number]   cc:   Madolyn Frieze. [name], MD, [facility]

## 2010-12-02 NOTE — Assessment & Plan Note (Signed)
[facility]                            CARDIOLOGY OFFICE NOTE   NAME:[name] [name], [name] [name]                       MRN:          [id number]  DATE:05/18/2007                            DOB:          [birth year]    Mr. Biondolillo is a 45 year old male who recently had coronary artery bypass  graft in September of 2008. However, on April 27, 2007, he had  recurrent chest pain and was admitted to [facility] and ruled  in for myocardial infarction. His catheterization was performed on  April 29, 2007. He had severe native 3-vessel disease. All grafts were  patent except for a saphenous vein graft to the diagonal which was  occluded. An attempt at PCI to the saphenous vein graft showed a distal  1 mm or less vessel. He was therefore treated medically. His ejection  fraction was 55% with subtle anterolateral hypokinesis. Since that time,  he denies any chest pain, shortness of breath, palpitations or syncope,  and there is no pedal edema. He apparently has had left foot drop since  his bypass surgery.   His medications at present include:  1. Aspirin 325 mg p.o. daily.  2. Lopressor 25 mg p.o. b.i.d.  3. Lisinopril 2.5 mg p.o. daily.  4. Zocor 80 mg p.o. daily.  5. Plavix 75 mg p.o. daily.  6. Protonix 40 mg p.o. daily.   His physical exam today shows a blood pressure of 121/78. His pulse is  81. He is well-developed and somewhat anxious. He is no acute distress.  His HEENT is normal.  His neck is supple.  His chest is clear.  His cardiovascular exam reveals a regular, rate, and rhythm. His  sternotomy is without evidence of infection.  Abdominal exam shows no tenderness.  His right groin shows a bruit, but there is no hematoma. Extremities  showed no edema.   DIAGNOSES:  1. Coronary artery disease - the patient is doing well from a      symptomatic standpoint. We will continue with medical therapy      including his aspirin, Plavix, statin,  Lopressor and ACE inhibitor.  2. Right femoral bruit - will schedule him to have an ultrasound to      exclude pseudoaneurysm given his recent procedure.  3. Hyperlipidemia - when he returns for his ultrasound, we will check      lipids and liver and adjust with a goal LDL of less than 70.  4. Hypertension - his blood pressure is well controlled on his present      medications.  5. History of tobacco and marijuana use - he has discontinued this.   We discussed the importance of risk factor modification including diet  and exercise and continuing to avoid tobacco use. I will see him back in  three months.     Madolyn Frieze [name], MD, [facility]  Electronically Signed    BSC/MedQ  DD: 05/18/2007  DT: 05/19/2007  Job #: [id number]

## 2010-12-02 NOTE — Assessment & Plan Note (Signed)
[facility]                            CARDIOLOGY OFFICE NOTE   NAME:[name] [name] DUFFUS                       MRN:          [id number]  DATE:08/09/2007                            DOB:          August 23, 1965    [name] is a 45 year old gentleman who has a history of coronary  artery disease status post coronary bypassing graft in September 2008.  In October 2008 he had chest pain and ruled in.  All of his grafts were  patent at the time of his catheterization except for the saphenous vein  graft to the diagonal which was occluded.  An attempt at PCI to the  saphenous vein graft showed a distal 1 mm or less vessel and he has been  treated medically.  Since then he has done well from a cardiac  standpoint.  He has not had chest pain, shortness breath, palpitations  or syncope.  He has had pain in his left foot ever since his bypass  surgery.  The pain is continuous.  It is on the top of his foot.  He  describes it as a numbness.  Note, he does not have any change in his  pain with activities.  He is also having problems with impotence and  feels it may be related to his Lopressor.   PRESENT MEDICATIONS:  1. Aspirin 325 mg daily.  2. Lopressor 25 mg p.o. b.i.d.  3. Zocor 80 mg p.o. nightly.  4. Plavix 75 mg p.o. daily.  5. Protonix 40 mg daily.  6. Niaspan 1 gram daily.  7. Neurontin.  8. Doxycycline.  9. Fish oil.  10.Multivitamin.  11.Xanax p.r.n.   PHYSICAL EXAMINATION:  Today shows a blood pressure 124/86 and his pulse  is 70.  He weighs 212 pounds.  HEENT:  Normal.  NECK:  Supple.  CHEST:  Clear.  CARDIOVASCULAR:  Reveals a regular rate and rhythm.  ABDOMINAL:  Shows no tenderness to palpation.  EXTREMITIES:  Show no edema.  Note he has 2+ dorsalis pedis and  posterior tibial pulse on the left.   DIAGNOSES:  1. Coronary artery disease status post coronary bypassing graft - He      is having no chest pain or shortness of breath and we will  continue      with medical therapy.  This will include his aspirin, Plavix,      statin.  He is complaining of impotence and we will discontinue the      Lopressor as this is the most likely cause.  I will resume his      lisinopril at 10 mg p.o. daily.  2. Hyperlipidemia - He is scheduled for lipids and liver today but we      will delay that for 1 week given the addition of his angiotensin      converting enzyme inhibitor and I will check a BMET, lipid and      liver all at the same time in 1 week.  3. Hypertension - We are adjusting his blood pressure medicines as      described above.  If his symptoms does not improve off of Lopressor      than we will resume this.  4. History of tobacco and marijuana use - He has discontinued this.  5. He will continue with risk factor modification including diet and      exercise.  Note, he is walking 20-30 minutes twice daily.  He is      also trying to follow a diet.  6. Foot pain - The etiology of this is not clear to me.  He has      excellent pulses and his symptoms are not related to activities.      He may have developed a neuropathy.  His primary care physician      will continue to take care of this.   We will see him back in 3 months.     Madolyn Frieze [name], MD, [facility]  Electronically Signed    BSC/MedQ  DD: 08/09/2007  DT: 08/09/2007  Job #: [id number]

## 2010-12-02 NOTE — Assessment & Plan Note (Signed)
[facility]                                 ON-CALL NOTE   NAME:Dolinger, Jaz J.                      MRN:          [id number]  DATE:04/30/2007                            DOB:          19-Dec-1965    SUPERVISING PHYSICIAN:  Dr. Myrtis Ser.   PRIMARY CARDIOLOGIST:  Dr. [name].   HISTORY:  Mr. Eakins was released not 15 minutes ago from [facility].  He calls back stating that Dr. Antoine Poche told him he could  have a prescription for Vicodin for discomfort, but he did not receive  this at the time of discharge.   I explained to Mr. Makki the only thing that Dr. Antoine Poche had informed  myself or had written in the chart that he may have was Xanax 0.25 mg  b.i.d. for 1 month.  He had not mentioned, nor recorded anything about  Vicodin.  I asked Mr. Bord if he needed something for his discomfort  since he was recently released from the hospital from bypass surgery,  and he was not tolerating the oxycodone.  He stated that he did not  think so.  I suggested that he try Extra Strength Tylenol.  I also  suggested that I could call Dr. Antoine Poche to find out more information  about a prescription for Vicodin, and call him back.  However, I also  informed him that if he did agree to the Vicodin, and he rewrote the  prescription, he would have to return to the hospital to pick it up, as  we could not call in a prescription for a narcotic.  The patient stated  that he would try taking the Extra Strength Tylenol for any discomfort,  and if he had any further problems he would call us back.      Joellyn Rued, PA-C  Electronically Signed      Luis Abed, MD, [facility]  Electronically Signed   EW/MedQ  DD: 04/30/2007  DT: 04/30/2007  Job #: [id number]

## 2010-12-02 NOTE — Assessment & Plan Note (Signed)
[facility]                               LIPID CLINIC NOTE   NAME:Heinbaugh, MATIS MONNIER                       MRN:          [id number]  DATE:10/03/2007                            DOB:          [birth year]    Mr. Ishida is seen back in the lipid clinic for further evaluation and  medication titration associated with his hyperlipidemia,  hypertriglyceridemia, and documented coronary disease on date of service  October 03, 2007 for continued evaluation, medication titration associated  with his hyperlipidemia.  He has been maintained on Lipitor 40 mg daily,  Omega-3 fatty acids 3 grams daily, Trilipix 135 daily, and Niaspan 1  gram daily.  He has been compliant.  He has worked with his wife to  change from white bread to white wheat bread.  He is eating more and  notes that he is gaining weight.  He drinks more than a gallon of sweet  tea every day to 2 days.  He is eating Cheerios and working to control  his diet as he has cut out fast food, but does continue to have the  sweet liquids regularly.  He has had more fish that was fried.  He is  working to change his oils that are utilized to canola, peanut, and  olive oil.  For walking, he is walking approximately 30 minutes each  day, sometimes more, at least 20 minutes in the morning and hopefully 20  minutes each evening.  He actually is attempting to jog a couple of  minutes, though I have recommended that he only do that at the  advisement of his cardiologist.   PAST MEDICAL HISTORY:  Can be found in my note from August 22, 2007.   PHYSICAL EXAMINATION:  Weight today is 236 pounds.  Blood pressure is  128/72, heart rate is 76.   Meds have been updated.  Of note is aspirin decreased to 81 mg daily,  and he is taking lisinopril 10 mg daily at this time instead of  metoprolol.   LABS:  On October 03, 2007 revealed a total cholesterol of 127,  triglycerides 247, HDL 23.2, LDL 54.  LFTs and CKs are within  normal  limits.   ASSESSMENT:  1. 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  We are limited      in our medication ability to up titrate at this time.  Will      continue these therapies and have the patient continue to work on      other components in order to further lower his triglycerides.  2. The patient will work to increase his diet fiber intake and lower      his sugar by reducing his      sweet tea and other liquids such as Cheerwine.  He will increase      his physical activity and see [place] back on Dec 01, 2007 at 9:30.      Shelby Dubin, PharmD, BCPS, CPP  Electronically Signed      Rollene Rotunda, MD, [facility]  Electronically Signed   MP/MedQ  DD:  11/09/2007  DT: 11/09/2007  Job #: [id number]   cc:   Madolyn Frieze. [name], MD, [facility]

## 2010-12-02 NOTE — Discharge Summary (Signed)
NAMESCOTTY, WEIGELT                ACCOUNT NO.:  [id number]   MEDICAL RECORD NO.:  [id number]          PATIENT TYPE:  INP   LOCATION:  2018                         FACILITY:  [facility]   PHYSICIAN:  Salvatore Decent. Dorris Fetch, M.D.DATE OF BIRTH:  1965/11/02   DATE OF ADMISSION:  04/13/2007  DATE OF DISCHARGE:  04/19/2007                               DISCHARGE SUMMARY   FINAL DIAGNOSES:  1. Severe three-vessel coronary artery disease.  2. Non-Q-wave myocardial infarction.   IN-HOSPITAL DIAGNOSES:  1. Postoperative volume overload.  2. Postoperative acute blood loss anemia.  3. Postoperative left foot drop.   SECONDARY DIAGNOSES:  1. Tobacco abuse.  2. Gastroesophageal reflux disease.  3. Degenerative disease and decompression laminectomy in 1992.   OPERATIONS AND PROCEDURES:  1. Cardiac catheterization.  2. Coronary artery bypass grafting x5 using a left internal mammary      artery to left anterior descending, saphenous vein graft to first      branch diagonal, saphenous vein graft to acute marginal, sequential      to posterior descending artery, free right internal mammary artery      to obtuse marginal #1.   THE PATIENT'S HISTORY AND PHYSICAL HOSPITAL COURSE:  Mr.  [name] [name] is a 61-  year gentleman who was admitted on April 13, 2007, with unstable  chest pain syndrome.  He ruled in for myocardial infarction and was  taken to the cardiac catheterization laboratory April 14, 2007.  Catheterization showed severe three-vessel coronary artery disease, not  amenable to percutaneous coronary intervention. The patient was referred  for coronary artery bypass grafting.  Dr. Dorris Fetch was consulted.  Dr. Dorris Fetch discussed with the patient undergoing coronary bypass  grafting.  Risks and benefits discussed with the patient.  The patient  acknowledged understanding and agreed to proceed.  Due to the patient  continuing to have chest pain post catheterization, it was decided to  take him emergently to the operating room for coronary artery bypass  grafting.  For details of the patient's past medical history and  physical exam, please see dictated H&P.   The patient was taken emergently to the operating room on April 14, 2007, where he underwent coronary artery bypass grafting x5 using left  internal mammary artery to left anterior descending artery, free right  internal mammary artery to obtuse marginal #1, saphenous vein graft to  first diagonal, saphenous vein graft sequential to acute marginal  posterior descending.  Endoscopic vein harvest on right side.  The  patient tolerated this procedure well and was transferred to the  intensive care unit in stable condition.   Postoperatively, the patient was noted to be hemodynamically stable.  He  was extubated the evening of surgery.  Post extubation, the patient  noted to be alert and oriented x4.  Neurologically intact.  Postop day  #1, the patient's vital signs were noted to be stable.  He was weaned  from all drips.  Swan-Ganz catheter discontinued in the normal fashion.  Postoperative chest x-ray was stable.  The patient had minimal drainage  from chest tube.  Chest tube discontinued in  the normal fashion.  Patient's hemoglobin and hematocrit showed he was slightly anemic at 10  at 31%, respectively and was planned to be followed.  The patient was  able to be transferred to PCTU postop day #1.   The morning of postop day #2, the patient was noted to have a left foot  drop.  The nurse was contacted.  On evaluation, the patient was noted to  have a left foot drop.  Sensory and neurology within normal limits.  Range of motion exercises shown and encouraged to strengthen the left  foot.  Later that morning upon evaluation by Dr. Dorris Fetch, the  patient's left foot drop had already started to improve.  He continued  to do range of motion exercises and was back to normal prior to  discharge.  While patient  was on telemetry floor, vital signs continued  to be monitored.  They remained stable.  He remained afebrile.  He was  able to be weaned off oxygen saturating greater than 90% on room air.  The patient did have slight volume overload postoperatively and was  initiated on diuretics.  The patient was back near baseline weight prior  to discharge.  His hemoglobin and hematocrit continued to be monitored  while on the floor.  He did drop slightly more to 7.9 and  23.3%,  respectively,  postop day #3 and was stable postop day #4. No further  decrease.  The patient was asymptomatic and continued to be monitored.  The patient remained in normal sinus rhythm postoperatively.  His  pulmonary status remained stable.  Incisions were clean, dry and intact  and healing well.  Postoperatively, the patient was out of bed  ambulating well without difficulty.  He was tolerating diet well.   Labs postop day #4 showed a white count 15.6, hemoglobin 7.9, hematocrit  23.3, platelet count 169.  Sodium of 132, potassium 4.1, chloride of 99,  bicarb of 28, BUN 22, creatinine 1.18, glucose of 103.   Pending the patient remains stable over next  24 hours,  plan is to  discharge him in the morning of postop day #5, April 19, 2007.   FOLLOWUP APPOINTMENTS:  Followup appointment arranged with Dr.  Dorris Fetch for May 17, 2007, at 11:45 a.m..  The patient will need  to obtain PA and lateral chest x-ray 30 minutes prior to this  appointment.  The patient will need to follow up with Dr. [name] in 2  weeks.  He will need to contact Dr. Ludwig Clarks office to make these  arrangements.   ACTIVITY:  Patient instructed no driving until released to do so, no  lifting over 10 pounds.  He is told to ambulate 3-4 times a day,  progress as tolerated, and to continue breathing exercises.   INCISIONAL CARE:  The patient is told to shower, washing his incisions  using soap and water.  He is to contact the office if he  develops any  drainage or opening from any of his incision sites.   DIET:  The patient educated on diet to be low-fat, low-salt.   DISCHARGE MEDICATIONS:  1. A aspirin 3-25 mg daily.  2. Lopressor 25 mg b.i.d.  3. Lipitor 80 mg at night.  4. Pepcid Complete p.r.n.  5. Oxycodone 5 mg 1-2 tablets q. 4-6 h p.r.n. pain.      Theda Belfast, PA      Salvatore Decent. Dorris Fetch, M.D.  Electronically Signed    KMD/MEDQ  D:  04/18/2007  T:  04/18/2007  Job:  [id number]   cc:   Madolyn Frieze. [name], MD, [facility]

## 2010-12-02 NOTE — H&P (Signed)
[name] [name], [name] [name]                ACCOUNT NO.:  [id number]   MEDICAL RECORD NO.:  [id number]          PATIENT TYPE:  EMS   LOCATION:  ED                           [facility]   PHYSICIAN:  Noralyn Pick. Eden Emms, MD, FACCDATE OF BIRTH:  03-24-1966   DATE OF ADMISSION:  04/13/2007  DATE OF DISCHARGE:                              HISTORY & PHYSICAL   PRIMARY CARE PHYSICIAN:  The patient does not had one.   PRIMARY CARDIOLOGIST:  He has been seen by Dr. [name] in the past,  2006.   PATIENT PROFILE:  A 45 year old married Caucasian male without prior  history of CAD, who presents with a two-day history of recurrent  exertional angina.   PROBLEM LIST:  1. Unstable angina.      a.     History of chest pain in September 2006, with reportedly       normal Myoview in our office.  2. Ongoing tobacco abuse.      a.     He has been smoking 1.5-2 packs day for the past 30 years.  3. Degenerative joint disease.      a.     Status post decompression and laminectomy in L5-S1 in 1992.  4. Positive family history of coronary artery disease in his father      diagnosed at age 102.   HISTORY OF PRESENT ILLNESS:  A 45 year old married Caucasian male with  positive family history of CAD and prior history of chest pain in  September 2006, with normal Myoview.  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  Approximately two weeks ago he was lying some carpet with his  wife and noticed some exertional chest discomfort and left scapula and  shoulder discomfort that resolved with rest after five minutes.  Two  days ago, he was again at work exerting himself fairly heavily and had a  recurrent episode of left scapula and shoulder discomfort without  associated symptoms, again resolving in about five minutes with rest.  Yesterday, while at work, he noted intermittent  exertional 5-7/10 left  scapula and shoulder pain and pressure with any amount of exertion.  He  had multiple episodes throughout the day and was very restless all night  last night, not sleeping very well as he could not get comfortable.  When he got this morning, he continued to have left scapular pressure  which moved across to the center of his chest and became associated with  diaphoresis and shortness of breath while he was at work and exerting  self.  He finally decided to come to the [facility] ED and upon  arrival, he was treated with one sublingual nitroglycerin with almost  complete relief and then a second nitroglycerin with complete relief.  He was started  IV nitroglycerin and is currently pain-free.  His ECG  shows no acute changes and his point of care markers are currently  negative.   ALLERGIES:  NO  KNOWN DRUG ALLERGIES.   HOME MEDICATIONS:  He takes Pepcid complete every night.  Here he has  been given Lopressor 5 mg IV as well as nitroglycerin infusion and two  nitroglycerin sublingual tablets.   FAMILY HISTORY:  Mother is age 68 with a history of diabetes.  Father is  age 50 with a history of CAD diagnosed at age 31.  He also has heart  failure and diabetes.  He has a brother who is alive and well.   SOCIAL HISTORY:  He lives in [place], [place], with his wife  and two children, ages 6 and 46.  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  He  has a 60 pack-year history tobacco abuse, smoking a pack and a half to  two packs a day over the past 30 years.  He denies any alcohol or drugs.  He does not routinely exercise.   REVIEW OF SYSTEMS:  Positive for chest pain, shortness of breath and  dyspnea on exertion.  Otherwise, all systems reviewed and negative.   PHYSICAL EXAMINATION:  VITAL SIGNS:  Afebrile, heart rate 84,  respirations 18, blood pressure is 132/64 pulse ox 90% on 4 liters.  GENERAL APPEARANCE:  Pleasant white male in no acute distress.  Awake,   alert and oriented x3.  HEENT:  Normal.  NECK:  No bruits or JVD.  LUNGS:  Respirations were unlabored.  Clear to auscultation.  CARDIAC:  Regular S1 and S2, no S3-4 or murmurs.  ABDOMEN:  Round, soft, nontender and nondistended.  Bowel sounds present  x4.  EXTREMITIES:  Warm, dry, pink.  No clubbing, cyanosis or edema.  Dorsalis pedis and posterior tibial pulses 2+ and equal bilaterally.  No  femoral bruits noted.  NEUROLOGIC:  Grossly intact and nonfocal.   Chest x-ray is pending.   EKG shows sinus rhythm at a rate of 70 beats per minute, no acute ST-T  changes compared to previous ECG in 2005.   Hemoglobin 17.6, hematocrit 51.6, WBC 9.6, platelets 217.  Sodium 138,  potassium 4.5, chloride 104, CO2 24, BUN 9, creatinine 1.3, glucose 147.  CK-MB less than 1, troponin-I less than 0.5.  Calcium 9.8.  INR 1.   ASSESSMENT/PLAN:  1. Unstable angina.  Symptoms very concerning for ischemia.  So far      cardiac markers are negative and ECGs are without acute change.      Plan to admit and transferred to [facility].      Cycle cardiac enzymes and heparin, nitroglycerin, aspirin, beta      blocker, Statin, 2b3a.  Plan cath in a.m.  2. Tobacco abuse cessation strongly advised.  Obtain smoking cessation      consult.  3. Lipid status currently unknown.  Check lipids and liver function      tests and will add Statin.  4. Gastroesophageal reflux disease. He takes Pepcid at home.  Will add      proton pump inhibitor.      Nicolasa Ducking, ANP      Noralyn Pick. Eden Emms, MD, [facility]  Electronically Signed    CB/MEDQ  D:  04/13/2007  T:  04/14/2007  Job:  [id number]

## 2010-12-02 NOTE — Consult Note (Signed)
NAMETAVITA, EASTHAM                ACCOUNT NO.:  [id number]   MEDICAL RECORD NO.:  [id number]          PATIENT TYPE:  INP   LOCATION:  2308                         FACILITY:  [facility]   PHYSICIAN:  Salvatore Decent. Dorris Fetch, M.D.DATE OF BIRTH:  12/04/1965   DATE OF CONSULTATION:  04/14/2007  DATE OF DISCHARGE:                                 CONSULTATION   REASON FOR CONSULTATION:  Severe three-vessel disease.   HISTORY OF PRESENT ILLNESS:  Mr. Lia is a 45 year old gentleman with a  strong family history of coronary disease who presents with a chief  complaint of chest pain.  He states over the past 6 months he has had  exertional shortness of breath and chest tightness that has gotten worse  over the past 2 weeks.  Two days ago, he had a severe episode, and then  on April 12, 2007, he had another significant episode that took  longer than normal to resolve.  Since then, he has had intermittent left  scapular shoulder pain and some chest pressure with any exertion.  His  chest pain worsened on the morning of April 13, 2007, and he came to  the hospital.  He had a non-ST myocardial infarction with a CPK of 8.3.  Today, he underwent cardiac catheterization where he was found to have  critically severe three-vessel coronary disease with subtotal occlusion  of his distal right coronary, 95% stenosis in his circumflex, 80% LAD  stenosis and significant disease in 2 diagonal branches as well.  He had  normal left ventricular function.  He is still having left shoulder and  scapular discomfort.   His past medical history significant for tobacco abuse, gastroesophageal  reflux.  He had normal Myoview scan in September 2006.  He has had  degenerative joint disease and decompression laminectomy in 1992.   MEDICATIONS:  Prior to admission were Pepcid.  Since admission, he has  been started on heparin, Integra, Lopressor, Protonix, Lipitor, aspirin.   He did receive 600 mg of Plavix this  morning.   He has no known drug allergies.   FAMILY HISTORY:  Significant for father having heart disease diagnosed  at 11.  He died at 30 with congestive heart failure.  He has siblings  with heart disease as well.   SOCIAL HISTORY:  He lives with his wife.  He owns a Microbiologist  business.  She has 2 children who are teenagers.  A 60-pack-year history  of smoking 2 packs a day for 30 years.  He is still smoking at the time  of this admission.   REVIEW OF SYSTEMS:  Early fatigue, shortness of breath with exertion.  No orthopnea, paroxysmal nocturnal dyspnea or peripheral edema.  No  stroke or TIA symptoms.  No history of excessive bleeding or bruising.  All other systems negative.   PHYSICAL EXAMINATION:  Mr. Willers is a well-appearing but anxious 40-year-  old white male in no acute distress.  Blood pressure 116/86, pulse is 64  and regular, respirations are 16.  GENERAL:  He is well-developed and well-nourished.  NEUROLOGICAL:  He is alert  and oriented x3.  He is appropriate and  grossly intact.  No focal deficits.  HEENT EXAM:  Is unremarkable.  NECK:  Is supple without thyromegaly, adenopathy or bruits.  CARDIAC EXAM:  Has a regular rate and rhythm.  Normal S1-S2.  No rubs or  murmurs or gallops.  LUNGS:  Clear with equal breath sounds bilaterally.  ABDOMEN:  Soft, nontender.  EXTREMITIES:  Without clubbing, cyanosis or edema.  He has 2+ pulses  throughout.  SKIN:  Is warm and dry.   LABORATORY DATA:  EKG shows no significant ST changes.  PT is 13.4, PTT  is 33.  Sodium 138, potassium 4.5, BUN and creatinine 9 and 1.3.  White  count 9.6, hematocrit 51, platelets 217.  Cardiac catheterization as  previously dictated.   IMPRESSION:  Mr. Discher is a 45 year old white male with a strong family  history of coronary disease with additional cardiac risk factor of  tobacco abuse, unknown whether he has had hypertension or  hyperlipidemia.  He presents with a non-Q-wave myocardial  infarction and  at catheterization had severe three-vessel disease with subtotal  occlusion of his right coronary with diminished flow into the posterior  descending and posterolateral branches.  Coronary artery bypass grafting  is indicated for survival benefit and relief of symptoms.  I have  discussed in detail with the patient's wife the nature of his disease  and the alternatives for treatment.  They understand that coronary  bypass grafting has risks including but not limited to death, stroke,  MI, DVT, PE, bleeding, possible need for transfusions, infections as  well as other organ system dysfunction including respiratory, renal or  GI complications.  He understands and accepts these risks and agrees to  proceed.  He is at relatively low risk with the exception of bleeding  and bleeding related complications due to the high dose of Plavix given  this morning as well as his Integrilin.   The OR has been notified.  Will proceed as soon as the OR is ready.      Salvatore Decent Dorris Fetch, M.D.  Electronically Signed     SCH/MEDQ  D:  04/14/2007  T:  04/15/2007  Job:  [id number]   cc:   Madolyn Frieze. [name], MD, [facility]  Bruce R. Juanda Chance, MD, [facility]

## 2010-12-02 NOTE — Assessment & Plan Note (Signed)
[facility]                               LIPID CLINIC NOTE   NAME:Saltzman, KURTIS ANASTASIA                       MRN:          [id number]  DATE:08/22/2007                            DOB:          [birth year]    REFERRING PHYSICIAN:  Madolyn Frieze. [name], MD, [facility]   Patient is seen in the lipid clinic for further evaluation and  medication titration associated with the secondary prevention associated  with his hyperlipidemia in the setting of documented coronary disease.  Mr. Rotert is seen again with his wife.  He states that he has had a hard  time sleeping and has not had any benefit from Benadryl or Unisom that  he has tried over the counter.  He has continued marijuana use at the  time of his heart attack in September, 2008 and has been able to refrain  from its use since that time.  The patient states that his diet has  improved somewhat.  He is avoiding fried foods; however, he does  continue to intake large quantities of sweet tea.  Breakfast includes  Honey Bunches of Oats.  Lunch is roast beef sandwich or soy hamburgers.  They use peanut oil for cooking [place] fries and Sapporo's soy sauce for  shrimp dip throughout snack.  Dinners include Shake 'N Bake preparations  for chicken and deer meat as notable findings.  Patient is walking 15-20  minutes two times a day each day.   PAST MEDICAL HISTORY:  1. Coronary disease.  2. Hyperlipidemia.  3. Hypertension.  4. Peripheral neuropathy.   CURRENT MEDICATIONS:  1. Neurontin 300 mg 3 times daily.  2. Plavix 75 mg daily.  3. Simvastatin 80 mg daily.  4. Aspirin 325 mg daily.  5. Prilosec over the counter daily.  6. Multivitamin daily.  7. Niaspan 1 gm daily at bedtime.  8. Omega 3 fatty acids 2000 mg in the morning, 1000 mg in the evening.  9. Lisinopril that was changed to metoprolol 25 mg twice daily, per      the patient.   DRUG ALLERGIES:  None are known.   PHYSICAL EXAMINATION:  Weight today in  the office is not obtained.  Blood pressure is 132/86.  Heart rate is 72.  Respirations are 16 and  comfortable.   LABS:  From August 18, 2007 revealed a total cholesterol of 133,  triglycerides elevated at 337, HDL 25, LDL 57.8.  LFTs are within normal  limits.   ASSESSMENT:  Mr. Quadros is a pleasant 45 year old patient who has  coronary artery disease but does not have triglycerides at goal.  Today  we will add a fiber acid derivative to his therapy, as fenofibrate is  not available in the office, we will have him try TriLipix 135 mg 1  capsule daily and will switch the patient from the simvastatin 80 mg  daily to Lipitor 40 mg daily to avoid inappropriate combinations,  increasing risk of myopathy.  The patient will work on his diet to  reduce his sugar intake, switch over towards Splenda and change from  white  rice to brown rice and change over to high wheat/high fiber bread  for intake.  Patient will increase his quantity of walking on a daily  basis.  He will have prescriptions for his Lipitor 40 and TriLipix be  scribed in to [facility] on [address] and will follow up in six weeks.   Patient was seen with Gerre Pebbles, PharmD resident.      Shelby Dubin, PharmD, BCPS, CPP  Electronically Signed      Rollene Rotunda, MD, [facility]  Electronically Signed   MP/MedQ  DD: 08/23/2007  DT: 08/23/2007  Job #: [id number]   cc:   Madolyn Frieze. [name], MD, [facility]

## 2010-12-02 NOTE — Assessment & Plan Note (Signed)
[facility]                            CARDIOLOGY OFFICE NOTE   NAME:[name] [name], [name] [name]                       MRN:          [id number]  DATE:09/28/2007                            DOB:          May 14, 1966    Mr. Gripp is a very pleasant gentleman who has a history of coronary  disease status post coronary bypass and graft in September 2008.  In  October, he had chest pain and ruled in and it was found that his  saphenous vein graft to the diagonal was occluded.. Since I last saw  him, he is doing well with no chest pain, shortness of breath,  palpitations or syncope and there is no pedal edema.  His impotence has  approved with the discontinuation of his Lopressor.   MEDICATIONS:  1. Aspirin 325 mg p.o. daily.  2. Plavix 75 mg p.o. daily.  3. Niaspan 1 gram p.o. daily.  4. Neurontin 300 mg tablets, 2 in the morning and 2 in the evening.  5. Fish oil 1 gram p.o. t.i.d.  6. Multivitamin 1 p.o. daily.  7. Lipitor 40 mg p.o. daily.  8. Lisinopril 10 mg p.o. daily.  9. Trilipix 135 mg p.o. daily.   PHYSICAL EXAM:  Blood pressure 126/72 and the pulse is 94. He weighs 220  pounds.  HEENT:  Normal.  NECK:  Supple.  CHEST:  Clear.  CARDIOVASCULAR:  Regular rate and rhythm..  ABDOMEN:  No tenderness.  EXTREMITIES:  No edema.   DIAGNOSIS:  1. Coronary artery disease status post coronary artery bypass and      graft - Mr. Homeyer is doing well with no chest pain or shortness of      breath.  We will continue with medical therapy.  He will continue      on his aspirin but I will decrease it to 81 mg p.o. daily.  We will      also continue his Plavix for 1 year following his recent infarct.      We will then discontinue this.  He will continue on his ACE      inhibitor and statin.  Note we discontinued his Lopressor as it was      causing impotence and this has now improved.  2. Hyperlipidemia - he will continue on his statin, fish oil, Niaspan      and  Trilipix. He is now been followed in the lipid clinic.  3. Hypertension - his blood pressure is adequately controlled on his      present medications.  4. History of tobacco use and marijuana use now resolved.   The patient will continue with  diet and exercise and we discussed this  today.  I will see him back in 6 months.     Madolyn Frieze [name], MD, [facility]  Electronically Signed    BSC/MedQ  DD: 09/28/2007  DT: 09/29/2007  Job #: [id number]

## 2010-12-02 NOTE — Discharge Summary (Signed)
[name] [name], [name] [name]                ACCOUNT NO.:  [id number]   MEDICAL RECORD NO.:  [id number]          PATIENT TYPE:  INP   LOCATION:  2020                         FACILITY:  [facility]   PHYSICIAN:  Rollene Rotunda, MD, FACCDATE OF BIRTH:  04/20/1966   DATE OF ADMISSION:  04/27/2007  DATE OF DISCHARGE:  04/30/2007                         DISCHARGE SUMMARY - REFERRING   The patient does not have a primary care physician.   DISCHARGE DIAGNOSES:  1. Non-ST elevated myocardial infarction.  2. Progressive coronary artery disease with total occlusion of the      saphenous vein graft to the diagonal, unable to perform      percutaneous coronary intervention.  3. Anxiety.  4. Recent five-vessel bypass surgery September 2008.  5. History of hyperlipidemia.  6. History of recent tobacco and marijuana cessation.   History as noted below.   PROCEDURES PERFORMED:  Cardiac catheterization and aborted percutaneous  coronary intervention on April 29, 2007 by Dr. Excell Seltzer.   BRIEF HISTORY:  [name] [name] is a 45 year old male who was recently  admitted from September 24 to September 29 with non-ST elevated  myocardial infarction.  Catheterization showed native three-vessel  coronary artery disease for which she underwent five-vessel bypass  surgery on April 14, 2007.  Initially, after discharge, he did well.  However, on the day of admission he had discomfort in left shoulder  radiating into his mid chest.  He became very anxious and developed  shortness of breath and diaphoresis.  EMS was activated.  However,  nitroglycerin did not improve his discomfort; morphine did in the  emergency room.   PAST MEDICAL HISTORY:  Is notable for:  1. Recent cessation of tobacco.  2. GERD.  3. Hyperlipidemia.  4. Status post laminectomy in 1992.  5. A bypass surgery and three-vessel coronary artery disease.   LABORATORY:  Chest x-ray on the 8th showed marked improvement compared  to prior study, minimal  atelectasis in the mid left lung zone persisted.  Admission H&H was 10.6 and 32., normal indices, platelets 578, WBCs  15.8.  Subsequent CBC on discharge on the 11th showed H&H was 12 and  36.5, normal indices, platelets 579, WBCs 9.3.  Admission PTT was 39, PT  16.3.  Sodium 140, potassium 45, BUN 16, creatinine 1.14, normal LFTs  except for AST was slightly elevated at 42.  Prior to discharge, sodium  was 139, potassium 4.1, BUN 13, creatinine 1.07, glucose 99.  Initial CK  was 139 with MB of 7.8 and relative index of 5.6 and troponin of 5.6.  These increased to 270 and 19.6 with a relative index of 7.3 and a  troponin 1.92.  They continued to rise to 316 and 24.4 with a relative  index 7.7 and a troponin of 2.49.  Subsequent enzymes were declining.  EKGs showed normal sinus rhythm, some T-wave inversion in V2.  Subsequent EKG showed resolution of the T-wave inversion in V2.   HOSPITAL COURSE:  The patient was admitted to [facility] by Dr.  Samuel Bouche to step-down.  He was placed on IV heparin as well as home  medications.  Dr. Antoine Poche evaluated the patient on the 9th, and the  patient had not had any further chest discomfort, and enzyme pattern was  elevated indicating possible MR and non-ST elevated myocardial  infarction painting a confusing picture.  Cardiac catheterization needed  to be considered.  Progression nurse assisted with discharge needs.  Catheterization performed on April 29, 2007 by Dr. Excell Seltzer did showed  native three-vessel coronary artery disease.  All grafts were patent.  However, the saphenous vein graft to the diagonal one was totally  occluded.  Attempted PCI of the saphenous vein graft to the diagonal one  showed crossing of the lesion, but distal balloon injection confirmed 1-  mm or less vessel, thus was aborted.  EF of 55% with subtle  anterolateral hypokinesis.  Dr. Excell Seltzer recommended continuing aspirin,  Plavix, statin and home treatment and felt that  he could potentially be  discharged on the 11th.  Cardiac rehab assisted with ambulation and  education and noted the patient could be very anxious and requesting  Xanax to help deal with cigarette and marijuana withdrawal.  On October  11, Dr. Antoine Poche reviewed and agreed with Dr. Excell Seltzer that [name] [name]  could be discharged home with continued medical management.  He did also  agree with cardiac rehab in regards to the Xanax.   DISPOSITION:  The patient is discharged home.  His activity and wound  care per supplemental discharge sheet postcatheterization.  He received  a new prescriptions for Xanax 0.25 mg b.i.d. for 1 month, lisinopril 2.5  mg daily, Zocor 80 mg every night, Plavix 75 mg daily, Protonix 40 mg  p.o. daily, nitroglycerin 0.4 p.r.n.  He was asked to continue his  aspirin 325 mg daily and his Lopressor 25 mg b.i.d.  He was asked to  discontinue his Lipitor.  He will need fasting lipids and LFTs in  approximately 6-8 weeks given statin initiation.  He was asked to bring  all medications to all follow-up appointments.  He had an appointment  this week with Dr. [name].  He is asked to cancel this appointment and  reschedule it for approximately 2 weeks.  He was asked to bring all  medications to all appointments.   Discharge time 45 minutes.      Joellyn Rued, PA-C      Rollene Rotunda, MD, [facility]  Electronically Signed    EW/MEDQ  D:  04/30/2007  T:  04/30/2007  Job:  [id number]   cc:   Madolyn Frieze. [name], MD, [facility]  Salvatore Decent. Dorris Fetch, M.D.

## 2010-12-02 NOTE — Op Note (Signed)
[name] [name], [name] [name]                ACCOUNT NO.:  [id number]   MEDICAL RECORD NO.:  [id number]          PATIENT TYPE:  INP   LOCATION:  2308                         FACILITY:  [facility]   PHYSICIAN:  Salvatore Decent. Hendrickson, M.D.DATE OF BIRTH:  1966/02/08   DATE OF PROCEDURE:  04/14/2007  DATE OF DISCHARGE:                               OPERATIVE REPORT   PREOPERATIVE DIAGNOSIS:  Severe three-vessel coronary artery disease,  status post non-Q-wave myocardial infarction, ongoing angina.   POSTOPERATIVE DIAGNOSIS:  Severe three-vessel coronary artery disease,  status post non-Q-wave myocardial infarction, ongoing angina.   PROCEDURE:  Median sternotomy, extracorporeal circulation, coronary  artery bypass grafting x5 (left internal mammary artery to left anterior  descending artery, free right internal mammary artery to obtuse marginal  #1, saphenous vein graft to first diagonal, saphenous vein graft  sequentially to acute marginal and posterior descending), endoscopic  vein harvest, right thigh.   SURGEON:  Salvatore Decent. Dorris Fetch, M.D.   ASSISTANTTheresia Lo.   ANESTHESIA:  General.   FINDINGS:  Transesophageal echocardiography revealed good wall motion  and no valvular pathology both pre and post bypass.  Emphysema.  OM-1  large-caliber but diffusely diseased and a fair-quality target.  Diagonal a fair-quality target.  LAD and acute marginal and posterior  descending, good-quality targets.  Right mammary short but of good  quality.  Left mammary good-quality.  Saphenous vein fair-quality.   CLINICAL NOTE:  [name] [name] is a 45 year old gentleman who was admitted on  April 13, 2007, with an unstable chest pain syndrome.  He ruled in  for myocardial infarction and was taken to the cardiac catheterization  laboratory on April 14, 2007.  He had had stuttering ongoing pain  throughout the night and day.  He continued to have pain post  catheterization.  At catheterization he had severe  three-vessel coronary  disease not amenable to percutaneous intervention and was referred for  coronary artery bypass grafting.  The indications, benefits and  alternatives were discussed in detail with the patient.  He understood  the increased bleeding risk given that he had received Plavix and  Integrilin.  He accepted the risks and agreed to proceed.   OPERATIVE NOTE:  [name] [name] was brought directly from the catheterization  lab holding area to the operating room on April 14, 2007.  There  lines were placed by anesthesia for monitoring arterial, central venous  and pulmonary arterial pressure.  ECG leads were already in place.  Intravenous antibiotics were administered.  The chest, abdomen and legs  were prepped and draped in the usual sterile fashion.  Transesophageal  echocardiography was performed.  It revealed normal wall motion and no  valvular pathology.  A median sternotomy was performed.  The left  internal mammary artery was harvested using standard technique.  Simultaneously an incision was made in the medial aspect of the right  leg at the level of the knee and the greater saphenous vein was  harvested from the knee to the groin endoscopically.  The saphenous vein  was of fair-quality.  The mammary was of good quality.  Five thousand  units of heparin was given prior during the vessel harvest.   Next the right internal mammary artery was harvested, again under direct  vision, using electrocautery.  Branches were doubly clipped and divided.  The right mammary was also a good-quality vessel but was relatively  short in length and it had a relatively high bifurcation.  It was not of  sufficient length to reach any target vessel as a pedicle graft.  Therefore, it was divided proximally.  The proximal stump was suture-  ligated with a 2-0 silk suture.   Of note, the patient had marked emphysematous changes in both lungs  consistent with his history of heavy smoking.    The pericardium was opened.  The ascending aorta was inspected.  It was  relatively small for the patient's size but was otherwise normal.  It  was cannulated via concentric 2-0 Ethibond nonpledgeted pursestring  sutures.  A dual-stage venous cannula was placed via pursestring suture  in the right atrial appendage.  Cardiopulmonary bypass was instituted  and he was cooled to 32 degrees Celsius.  The coronary arteries were  inspected and anastomotic sites were chosen.  The conduits were  inspected and cut to length.  A foam pad was placed in the pericardium  to protect the left phrenic nerve, a temperature probe was placed in  myocardial septum, and a cardioplegia cannula was placed in the  ascending aorta.   The aorta was crossclamped.  The left ventricle was emptied via the  aortic root vent.  Cardiac arrest then was achieved with a combination  of cold antegrade blood cardioplegia and topical iced saline, 750 mL of  cardioplegia was administered and myocardial septal temperature fell to  9 degrees Celsius.  The following distal anastomoses were performed:   First a reversed saphenous vein graft was placed sequentially to the  acute marginal and posterior descending branches of the right coronary.  The right coronary was diffusely diseased and subtotally occluded beyond  the acute marginal, filling a posterior descending and posterolateral  that were in communication.  The posterolateral itself was too small to  graft.  A side-to-side anastomosis was performed to the acute marginal  and end-to-side to the posterior descending.  Both of these were 1.5-mm  good-quality targets.  The vein graft was of relatively large size and  fair quality.  All anastomoses were probed proximally and distally at  their completion and cardioplegia was administered down the graft to  inspect for flow and hemostasis.   Next a reversed saphenous vein graft was placed end-to-side to the first  diagonal  branch of the LAD.  This was a 1.3-mm vessel.  It was of fair-  quality.  The vein again was relatively large.  It was anastomosed end-  to-side with a running 7-0 Prolene suture.  The second diagonal was too  small to graft.   Next the distal end of the right mammary artery was beveled and was  anastomosed end-to-side to obtuse marginal #1.  This was the large,  dominant circumflex branch supplying the lateral wall.  This vessel was  diseased diffusely but was a large caliber and a probe did pass easily  distally from the site of the anastomosis.  The anastomosis was  performed with a running 7-0 Prolene suture.   Next the left internal mammary artery was brought through a window in  the pericardium.  The distal end was beveled it was anastomosed end-to-  side to the distal LAD.  The  LAD was a 1.5-mm good-quality target.  The  mammary was a good-quality conduit.  The anastomosis was performed with  a running 8-0 Prolene suture.  At completion of the mammary to LAD  anastomosis, the bulldog clamp was briefly removed and inspected for  hemostasis.  Immediate and rapid septal rewarming was noted.  The  bulldog clamp was replaced and the mammary pedicle was tacked to the  epicardial surface of the heart with 6-0 Prolene sutures.   While the patient was being rewarmed, the vein grafts were cut to length  and the proximal vein graft anastomoses were performed to 4.5 mm punch  aortotomies with running 6-0 Prolene sutures.  Again the right mammary  even as a free graft was not of sufficient length to reach to the aorta.  Therefore, it was beveled at its proximal end and anastomosed end-to-  side to the saphenous vein to the diagonal.  At the completion of the  final proximal anastomosis, the patient was placed in Trendelenburg  position.  Lidocaine was administered.  The bulldog clamp was again  removed from the left mammary artery.  The aortic root was de-aired.  The aortic crossclamp was  removed and the final proximal suture for the  vein graft was tied.  The patient spontaneously converted to sinus  rhythm and did not require defibrillation.  Total crossclamp time was 91  minutes.   All proximal and distal anastomoses were inspected for hemostasis.  Epicardial pacing wires were placed on the right ventricle and right  atrium.  When the patient had rewarmed to a core temperature of 37  degrees Celsius, he was weaned from cardiopulmonary bypass on the first  attempt without difficulty.  The total bypass time was 134 minutes.  The  initial cardiac index greater than 2 L/min. per sq. m and he remained  hemodynamically stable throughout post bypass period.   A test dose protamine was administered as well-tolerated, and the atrial  and aortic cannulae were removed.  The remainder of the protamine was  administered without incident.  The chest was irrigated with 1 L of warm  normal saline containing 1 g of vancomycin.  Hemostasis was achieved.  Bilateral pleural and a single mediastinal chest tube were placed  through separate subcostal incisions.  The pericardium was  reapproximated with interrupted 3-0 silk sutures.  It came together  easily without tension or kinking of the underlying grafts.  The sternum  was closed with interrupted heavy-gauge stainless steel wires.  The  pectoralis fascia, subcutaneous tissue and skin were closed in a  standard fashion.  The sponge and instrument counts were correct at the  end of the procedure.  There was a missing needle from a 7-0 Prolene  suture.  An x-ray will be performed in the surgical intensive care unit.   [name] [name] was transported from the operating room to the surgical  intensive care unit in stable condition.      Salvatore Decent Dorris Fetch, M.D.  Electronically Signed     SCH/MEDQ  D:  04/14/2007  T:  04/15/2007  Job:  [id number]   cc:   Everardo Beals. Juanda Chance, MD, [facility]  Madolyn Frieze. [name], MD, [facility]

## 2010-12-02 NOTE — Assessment & Plan Note (Signed)
[facility]                               LIPID CLINIC NOTE   NAME:Halberg, GWIN EAGON                       MRN:          [id number]  DATE:07/18/2007                            DOB:          Feb 21, 1966    The patient is seen in the lipid clinic for secondary prevention due to  hyperlipidemia.  The patient states that he has a past medical history  of myocardial infarction in September of 2008 status post bypass for  CAD.  He has a history of femoral bruit on the right side as well as  hyperlipidemia, hypertension, and history of tobacco and marijuana use.   CURRENT MEDICATIONS:  1. Metoprolol 25 mg twice daily.  2. Prilosec daily.  3. Aspirin 325 mg daily.  4. Zocor 80 mg daily.  5. Plavix 75 mg daily.  6. Omega-3 fatty acids two tablets in the morning and one tablet in      the evening.  7. Neurontin 300 mg three times daily.  8. Multivitamin daily.  9. Niaspan 500 mg daily though the patient did have some tingling      associated with this therapy.  He has questions regarding his      erectile dysfunction issues since surgery.   ALLERGIES:  No known drug allergies.   SOCIAL HISTORY:  The patient has been smoking for 35 years.  He has had  no marijuana since April 12, 2007.  He has had no cigarette smoking  since that time.  He drinks alcohol only rarely, one drink of hard  liquor approximately a month.  He has decreased his shortening intake,  he has changed over to appropriate oils.  He uses salt substitute.  He  has changed from can to frozen vegetables and leaner meats.  He has also  gotten lots of information and has a very actively involved ensuring  compliance with recommendations.  The patient is trying to exercise 30  minutes each day on the treadmill, though his neuropathic pain limits  his compliance with this recommendation.   PHYSICAL EXAMINATION:  VITAL SIGNS:  Blood pressure today is 134/84,  heart rate 68, respirations 17  and regular.   LABORATORY DATA:  On May 20, 2007, revealed total cholesterol of  145, triglycerides 334, HDL 22.1, LDL 79.8, LFT's are within normal  limits.  On July 05, 2007, the total cholesterol is 154,  triglycerides 282, HDL 23.1, LDL 79.9, and liver functions within normal  limits.   ASSESSMENT:  The patient is a 45 year old gentleman with CAD who has  lipids not at goal on Zocor 80 mg and Niaspan 500 mg daily for a month.  The patient will continue to work toward positive lifestyle changes and  increase his Niaspan to 1 gram daily at bedtime.  Follow-up in five  weeks.  The patient will move his daily aspirin to 30 minutes before his  Niaspan and he will continue to work on improvements and he will try to  increase his walking to greater than or equal to 30 minutes 7 days each  week.  He will follow up and will consider a combination Symbicor  product in the future.  I will see the patient back in follow-up in 4-6  weeks.  Thank you for the opportunity to see this pleasant patient.      Shelby Dubin, PharmD, BCPS, CPP  Electronically Signed      Jesse Sans. Daleen Squibb, MD, [facility]  Electronically Signed   MP/MedQ  DD: 08/18/2007  DT: 08/18/2007  Job #: [id number]   cc:   Madolyn Frieze. [name], MD, [facility]

## 2010-12-02 NOTE — Assessment & Plan Note (Signed)
[facility]                                 ON-CALL NOTE   NAME:[name] [name], [name] [name]                       MRN:          [id number]  DATE:04/29/2007                            DOB:          Dec 12, 1965    PRIMARY CARDIOLOGIST:  Dr. [name].   SUMMARY/HISTORY:  Mrs. Mollett calls and states that, when he was  discharged from the hospital just a few hours ago, he was changed from  Lipitor to Zocor secondary to his cough.  She was wondering if he could  finish out his Lipitor before beginning the Zocor, since they have  already spent $110 on this medication.  The patient was not having any  side effects that prompted the change in medications.  It was due to  cost only.  Thus, I explained to Ms. Hangartner, that it would be fine if he  continued his Lipitor and then resumed his Zocor that he received at the  time of discharge.   Regarding followup, he will need FLP and LFTs in approximately 6 to 8  weeks after he has resumed the Zocor.      Joellyn Rued, PA-C  Electronically Signed      Luis Abed, MD, [facility]  Electronically Signed   EW/MedQ  DD: 04/30/2007  DT: 04/30/2007  Job #: [id number]

## 2010-12-02 NOTE — Assessment & Plan Note (Signed)
[name] [name]                            CARDIOLOGY OFFICE NOTE   NAME:[name], [name] [name]                       MRN:          [id number]  DATE:04/18/2008                            DOB:          June 27, 1966    [name] [name] is a 45 year old gentleman who has a history of coronary  artery disease status post coronary artery bypassing graft in September  2008.  In October 2008, he had recurrent chest pain and positive  enzymes.  Her followup catheterization revealed that his saphenous vein  graft to the diagonal was occluded.  Since I last saw him, he has not  had any chest pain, shortness of breath, pedal edema, or syncope.  He  does continue to have some pain in his left foot that he attributes to  his surgical procedure.   MEDICATIONS:  1. Slo-Niacin 1 g p.o. daily.  2. Aspirin 81 mg p.o. daily.  3. Plavix 75 mg p.o. daily.  4. Neurontin 600 mg in the morning and in the evening as well.  5. Fish oil.  6. Multivitamin.  7. Lipitor 40 mg p.o. daily.  8. Lisinopril 10 mg p.o. daily.   PHYSICAL EXAMINATION:  VITAL SIGNS:  His blood pressure is 126/73.  His  pulse is 82.  HEENT:  Normal.  NECK:  Supple.  CHEST:  Clear.  CARDIOVASCULAR:  Regular rate and rhythm.  ABDOMEN:  No tenderness.  EXTREMITIES:  No edema.   Electrocardiogram shows sinus rhythm at a rate of 82.  There are no ST  changes noted.   DIAGNOSES:  1. Coronary artery disease status post coronary artery bypassing graft      - [name] [name] has had no chest pain or shortness of breath.  He is      now approximately 1 year since his previous event where his cardiac      markers were elevated felt possibly secondary to subendocardial      myocardial infarction.  Due to his financial issues, we will      discontinue his Plavix and continue his aspirin.  He will continue      on his ACE inhibitor and statin.  Note, a beta-blocker had been      used previously, but it was causing impotence and this  was      discontinued.  2. Hyperlipidemia - he will continue on his statin, but we are      discontinuing his Lipitor after his present prescription expires      instead treat with Zocor 80 mg p.o. daily.  He is being followed in      our Lipid Clinic.  3. Hypertension - have his blood pressure adequately controlled.  4. History of tobacco and marijuana use - he has completely resolved      this.  5. The patient will continue with diet and exercise and I will see him      back in 1 year.     [name] Frieze [name], MD, [facility]  Electronically Signed    BSC/MedQ  DD: 04/18/2008  DT: 04/19/2008  Job #: [id number]

## 2010-12-02 NOTE — Assessment & Plan Note (Signed)
[facility]                               LIPID CLINIC NOTE   NAME:[name] [name], [name] [name]                       MRN:          [id number]  DATE:04/12/2008                            DOB:          Sep 26, 1965    The patient is seen in Lipid Clinic for further evaluation, medication,  titration associated with hyperlipidemia in the setting of documented  disease.  The patient's current lipid lowering therapies includes  Lipitor 40 mg daily, niacin 1000 mg daily, and fish oil 1200 mg daily.  The patient has not been as compliant over the past several weeks as he  has been unable to afford meds.  He has been tolerating when he has  taken it without an issue.  The patient quit smoking a year ago and does  not use alcohol on a regular basis.  The patient has been using Canola  and olive oil to fry food, drinks lots of sweet tea, and soft drinks,  this has continued without as much of a switch to water as expected and  the patient has continued to eat lots of fish, chicken, and vegetables.  Since his surgery, his foot drop has continued, his walking has been  limited.  He is trying to walk 3 times a week for 25-30 minutes on a  treadmill at about 3 miles an hour, and continues to do yard work.  Weight today 211 pounds, blood pressure is 144/70, heart rate is 56.  Labs on April 05, 2008, revealed total cholesterol of 198,  triglycerides 397, HDL 29.7, LDL 134.  LFTs within normal limits.   ASSESSMENT:  The patient is having problems paying for his meds.  He has  been off of Plavix, as well as Lipitor.  We will decrease his aspirin 81  mg rather than 325.  The patient's wife do seem concerned about health  changes and willing to make more lifestyle changes, but I just  limited  in their financial ability to do this at this time.   PLAN:  1. Diet therapy.  Will decrease sweet tea, soft drinks, and fried      foods.  2. The patient appears very motivated, increase  exercise, we will      recommend increase of exercise to 5 days a week.  The patient will      change Lipitor to simvastatin 80 daily and monitor LFTs in 3 months      and call with questions or problems in the meantime.  He has had      taken his simvastatin 80 without issue in the past.  If he has      muscle aches, pains, significant fatigue or other problems, he will      call us.      Shelby Dubin, PharmD, BCPS, CPP  Electronically Signed      Madolyn Frieze. [name], MD, [facility]  Electronically Signed   MP/MedQ  DD: 04/13/2008  DT: 04/14/2008  Job #: [id number]   cc:   Madolyn Frieze. [name], MD, [facility]

## 2010-12-02 NOTE — H&P (Signed)
NAMEOREL, COOLER                ACCOUNT NO.:  [id number]   MEDICAL RECORD NO.:  [id number]          PATIENT TYPE:  INP   LOCATION:  2917                         FACILITY:  [facility]   PHYSICIAN:  Rod Holler, MD     DATE OF BIRTH:  05-10-1966   DATE OF ADMISSION:  04/27/2007  DATE OF DISCHARGE:                              HISTORY & PHYSICAL   CHIEF COMPLAINTS:  Chest pain.   HISTORY:  Mr. Weide is a pleasant 45 year old male with a history of  coronary artery disease, status post coronary artery bypass graft,  history of non-ST elevation MI.  He did very well after his discharge  from the hospital a couple of weeks ago status post CABG and returns to  the emergency department with complaints of chest pain.  As stated  above, he did extremely well until today after being discharged to home.  Today while emptying his dishwasher, he had onset of discomfort in his  left shoulder with radiation to his mid chest.  The patient sat down and  became anxious.  With this, the chest discomfort and shoulder discomfort  became worse with associated shortness of breath and diaphoresis.  At  that time, he activated EMS.  He was given sublingual nitroglycerin by  EMS which did not improve the discomfort.  The pain was improved with  morphine in the emergency department.  He has had no recent syncope or  presyncope, palpitations, PND or orthopnea, no recent lower extremity  swelling.  In the emergency department, he was given morphine and now he  is chest pain-free.   PAST MEDICAL HISTORY:  1. Tobacco use, recently stopped.  2. Status post laminectomy, L5-S1 in 1992.  3. GERD.  4. Coronary artery disease, status post non-ST elevation MI, status      post LIMA to LAD, free RIMA to OM-1, saphenous vein graft to D1,      sequential saphenous vein graft to obtuse marginal and PDA      September 2008.   MEDICINES:  1. Aspirin 325 mg p.o. daily.  2. Lopressor 25 mg p.o. b.i.d.  3. Lipitor 80 mg  p.o. daily.  4. Pepcid p.r.n.   ALLERGIES:  NO KNOWN DRUG ALLERGIES.   SOCIAL HISTORY:  The patient used to smoke one and a half to two packs  per day but quit after his CABG.   FAMILY HISTORY:  Father with history of early coronary artery disease in  his mid 93s, mother with history of diabetes mellitus, but is still  living.   REVIEW OF SYSTEMS:  All systems reviewed in detail and are negative  except as noted in history of present illness.   PHYSICAL EXAMINATION:  VITAL SIGNS:  Temperature 98.1, heart rate 79,  respiratory rate 16, oxygen saturation 97%.  Blood pressure 118/76.  GENERAL:  Well-developed, well-nourished, tan male, alert and oriented  x3, no apparent distress.  HEENT: Atraumatic, normocephalic.  Pupils equal, round, reactive to  light.  Extraocular sounds intact.  Oropharynx clear.  NECK:  Supple.  No adenopathy, no JVD, no carotid bruits.  CHEST:  Slightly decreased breath sounds bibasilar, otherwise clear to  auscultation bilaterally with equal bilateral breath sounds.  CARDIAC:  Regular rhythm, normal rate, normal S1-S2, no murmurs, rubs or  gallops, 1+ peripheral pulses.  ABDOMEN:  Soft, nontender, nondistended.  Active bowel sounds.  EXTREMITIES:  No clubbing, cyanosis or edema.  NEUROLOGIC:  No focal deficits.   EKG shows normal sinus rhythm with nonspecific ST changes.   LABORATORY DATA:  Sodium 137, potassium 5.5, repeat potassium 4.4,  chloride 107, bicarb 23, BUN 16, no creatinine was performed, glucose  107.  White blood cell count 15.8, hematocrit 32, platelet count 578.  Myoglobin 375, troponin 0.19, repeat troponin 0.56. Point of care CK  139, CK-MB 7.8, CK-MB index 5.6.   Chest x-ray shows left basilar atelectasis.   IMPRESSION AND PLAN:  A 45 year old male, coronary artery disease status  post non-ST-elevation myocardial infarction, recent coronary artery  bypass graft, discharged from the hospital a couple weeks ago and  returns with chest  pain with exertion, abnormal myoglobin, CK-MB and  troponin.   PLAN:  1. Cardiovascular:  Admit the patient to a step-down unit, daily      aspirin, Lipitor 80 mg p.o. daily, Lopressor 25 mg p.o. b.i.d.,      lisinopril 2.5 mg p.o. daily, heparin bolus and drip per the      pharmacy protocol, serial cardiac enzymes, daily EKG, BNP in the      morning.  2. Fluids, electrolytes, and nutrition:  Cardiac diet, n.p.o. after      midnight, CMP and magnesium level in the morning.  3. Hematologic:  Coagulation panel in the morning, guaiac all stools.  4. GI:  Place the patient on Protonix 40 mg p.o. daily.      Rod Holler, MD  Electronically Signed     TRK/MEDQ  D:  04/27/2007  T:  04/28/2007  Job:  [id number]

## 2010-12-02 NOTE — Op Note (Signed)
NAMEBRIA, PORTALES                ACCOUNT NO.:  [id number]   MEDICAL RECORD NO.:  [id number]          PATIENT TYPE:  INP   LOCATION:  2308                         FACILITY:  [facility]   PHYSICIAN:  Bedelia Person, M.D.        DATE OF BIRTH:  05-06-1966   DATE OF PROCEDURE:  04/14/2007  DATE OF DISCHARGE:                               OPERATIVE REPORT   PROCEDURE:  Intraoperative transesophageal cardiography.   Mr. Madole is an emergency coronary artery bypass grafting delivered to  the operating room from the cath lab.  He has some sedation.  It is  difficult to get a good history but appears he has no significant  history of esophageal or gastric problems.  After induction of general  anesthesia the airway was secured with an oral endotracheal tube and the  transesophageal echo transducer was heavily lubricated and placed down  the oropharynx without difficulty.  The probe was manipulated to obtain  various views, the prebypass examination revealed a left ventricle to be  normal thickness.  There were no segmental contractility deficits.  The  left atrium was normal size.  The appendage was clean.  This atrial  septum was intact.  Mitral valve had two leaflets that opened and closed  appropriately.  Color Doppler reveal trace central mitral insufficiency.  The aortic valve at three leaflets.  All leaflets opened and closed  appropriately.  No calcification was noted.  Color Doppler revealed no  aortic insufficiency.  The aorta was normal in appearance.  Right heart  exam was essentially normal with the tricuspid valve showing the Swan-  Ganz catheter across it.  There were no other abnormalities noted.           ______________________________  Bedelia Person, M.D.     LK/MEDQ  D:  04/14/2007  T:  04/15/2007  Job:  [id number]

## 2010-12-05 NOTE — Assessment & Plan Note (Signed)
[facility]                                 ON-CALL NOTE   NAME:[name], [name] LINA                       MRN:          [id number]  DATE:05/24/2007                            DOB:          03-Sep-1965    PRIMARY CARDIOLOGIST:  Madolyn Frieze. [name], MD, F.A.C.C.   His wife called because she stated [name] was running a fever and not  feeling very well.  She stated that he had a temperature up to 100.8 and  it had briefly been over 101.  She had given him Tylenol.   I questioned her regarding symptoms.  [name] was not complaining of  any cough or cold symptoms.  He was denying dysuria.  She stated his  cath site looked the same and there was no drainage or new swelling or  ecchymosis.  He did not complain of a sore throat or any problems with  his ears.  His only complaint was general malaise.   I advised her, if she wanted him evaluated, she would have to bring him  to the emergency room.  I advised her that I did not see any clear  cardiac cause of his symptoms, but if there was any possibility that his  cath site was infected, she needed to get him seen immediately.  She  stated she did not think so and his cath site was not tender in a new  way.  I advised her that, if she wanted to give him Tylenol and follow  his symptoms overnight, as long as he was not complaining of anything  specific, that would probably be okay, but if she had any concerns, she  should call 911 and bring him to the emergency room.  Ms. Yale stated  that she would do so.      Theodore Demark, PA-C  Electronically Signed      Madolyn Frieze. [name], MD, [facility]  Electronically Signed   RB/MedQ  DD: 05/24/2007  DT: 05/25/2007  Job #: [id number]

## 2011-03-04 ENCOUNTER — Encounter: Payer: Self-pay | Admitting: Cardiology

## 2011-03-12 ENCOUNTER — Emergency Department (HOSPITAL_COMMUNITY): Payer: Self-pay

## 2011-03-12 ENCOUNTER — Encounter (HOSPITAL_COMMUNITY): Payer: Self-pay | Admitting: Radiology

## 2011-03-12 ENCOUNTER — Encounter: Payer: Self-pay | Admitting: Internal Medicine

## 2011-03-12 ENCOUNTER — Emergency Department (HOSPITAL_COMMUNITY)
Admission: EM | Admit: 2011-03-12 | Discharge: 2011-03-13 | Disposition: A | Discharge status: Home / Self Care | Payer: Self-pay | Attending: Emergency Medicine | Admitting: Emergency Medicine

## 2011-03-12 DIAGNOSIS — K5289 Other specified noninfective gastroenteritis and colitis: Secondary | ICD-10-CM | POA: Insufficient documentation

## 2011-03-12 DIAGNOSIS — K219 Gastro-esophageal reflux disease without esophagitis: Secondary | ICD-10-CM | POA: Insufficient documentation

## 2011-03-12 DIAGNOSIS — R509 Fever, unspecified: Secondary | ICD-10-CM | POA: Insufficient documentation

## 2011-03-12 DIAGNOSIS — R112 Nausea with vomiting, unspecified: Secondary | ICD-10-CM | POA: Insufficient documentation

## 2011-03-12 DIAGNOSIS — Z951 Presence of aortocoronary bypass graft: Secondary | ICD-10-CM | POA: Insufficient documentation

## 2011-03-12 DIAGNOSIS — I2510 Atherosclerotic heart disease of native coronary artery without angina pectoris: Secondary | ICD-10-CM | POA: Insufficient documentation

## 2011-03-12 DIAGNOSIS — I10 Essential (primary) hypertension: Secondary | ICD-10-CM | POA: Insufficient documentation

## 2011-03-12 DIAGNOSIS — R1032 Left lower quadrant pain: Secondary | ICD-10-CM | POA: Insufficient documentation

## 2011-03-12 DIAGNOSIS — K7689 Other specified diseases of liver: Secondary | ICD-10-CM | POA: Insufficient documentation

## 2011-03-12 DIAGNOSIS — R109 Unspecified abdominal pain: Secondary | ICD-10-CM | POA: Insufficient documentation

## 2011-03-12 DIAGNOSIS — R197 Diarrhea, unspecified: Secondary | ICD-10-CM | POA: Insufficient documentation

## 2011-03-12 LAB — COMPREHENSIVE METABOLIC PANEL
AST: 45 U/L — ABNORMAL HIGH (ref 0–37)
Albumin: 4.3 g/dL (ref 3.5–5.2)
Alkaline Phosphatase: 65 U/L (ref 39–117)
BUN: 27 mg/dL — ABNORMAL HIGH (ref 6–23)
CO2: 19 mEq/L (ref 19–32)
Chloride: 98 mEq/L (ref 96–112)
GFR calc non Af Amer: 50 mL/min — ABNORMAL LOW (ref >60)
Potassium: 4 mEq/L (ref 3.5–5.1)
Total Bilirubin: 0.4 mg/dL (ref 0.3–1.2)

## 2011-03-12 LAB — CBC
HCT: 50.2 % (ref 39.0–52.0)
Hemoglobin: 18 g/dL — ABNORMAL HIGH (ref 13.0–17.0)
MCV: 92.1 fL (ref 78.0–100.0)
Platelets: 151 10*3/uL (ref 150–400)
RBC: 5.45 MIL/uL (ref 4.22–5.81)
WBC: 16.1 10*3/uL — ABNORMAL HIGH (ref 4.0–10.5)

## 2011-03-12 LAB — LIPASE, BLOOD: Lipase: 31 U/L (ref 11–59)

## 2011-03-12 LAB — URINALYSIS, ROUTINE W REFLEX MICROSCOPIC
Bilirubin Urine: NEGATIVE
Specific Gravity, Urine: 1.036 — ABNORMAL HIGH (ref 1.005–1.030)
pH: 5.5 (ref 5.0–8.0)

## 2011-03-12 LAB — DIFFERENTIAL
Eosinophils Relative: 0 % (ref 0–5)
Monocytes Absolute: 1 10*3/uL (ref 0.1–1.0)
Neutrophils Relative %: 87 % — ABNORMAL HIGH (ref 43–77)

## 2011-03-12 LAB — URINE MICROSCOPIC-ADD ON

## 2011-03-12 MED ORDER — IOHEXOL 300 MG/ML  SOLN
100.0000 mL | Freq: Once | INTRAMUSCULAR | Status: AC | PRN
  Administered 2011-03-12: 100 mL via INTRAVENOUS

## 2011-03-14 LAB — URINE CULTURE

## 2011-04-30 LAB — BASIC METABOLIC PANEL
BUN: 13
BUN: 13
BUN: 11
BUN: 17
BUN: 20
CO2: 27
CO2: 21
CO2: 26
Calcium: 8.4
Chloride: 104
Chloride: 105
Chloride: 111
Chloride: 99
Creatinine, Ser: 1.07
Creatinine, Ser: 1.15
Creatinine, Ser: 1.18
Creatinine, Ser: 1.3
GFR calc Af Amer: >60
GFR calc Af Amer: >60
GFR calc non Af Amer: >60
GFR calc non Af Amer: >60
GFR calc non Af Amer: >60
Glucose, Bld: 104 — ABNORMAL HIGH
Glucose, Bld: 158 — ABNORMAL HIGH
Potassium: 4.1
Potassium: 3.5
Potassium: 4
Potassium: 4.1
Potassium: 4.5
Sodium: 132 — ABNORMAL LOW
Sodium: 138

## 2011-04-30 LAB — POCT I-STAT 4, (NA,K, GLUC, HGB,HCT)
Glucose, Bld: 100 — ABNORMAL HIGH
Glucose, Bld: 111 — ABNORMAL HIGH
Glucose, Bld: 117 — ABNORMAL HIGH
Glucose, Bld: 127 — ABNORMAL HIGH
Glucose, Bld: 95
HCT: 26 — ABNORMAL LOW
HCT: 28 — ABNORMAL LOW
HCT: 28 — ABNORMAL LOW
HCT: 31 — ABNORMAL LOW
Hemoglobin: 10.2 — ABNORMAL LOW
Hemoglobin: 14.6
Hemoglobin: 9.5 — ABNORMAL LOW
Hemoglobin: 9.5 — ABNORMAL LOW
Operator id: 123161
Operator id: 3291
Operator id: 3291
Operator id: 3291
Operator id: 3291
Operator id: 3291
Potassium: 3.8
Potassium: 4.2
Potassium: 4.5
Potassium: 5.2 — ABNORMAL HIGH
Potassium: 5.3 — ABNORMAL HIGH
Potassium: 6.2 — ABNORMAL HIGH
Sodium: 133 — ABNORMAL LOW
Sodium: 133 — ABNORMAL LOW
Sodium: 137

## 2011-04-30 LAB — TROPONIN I
Troponin I: 1.92
Troponin I: 1.94
Troponin I: 2.49
Troponin I: 0.54

## 2011-04-30 LAB — POCT I-STAT 3, ART BLOOD GAS (G3+)
Acid-base deficit: 2
Acid-base deficit: 5 — ABNORMAL HIGH
Acid-base deficit: 6 — ABNORMAL HIGH
Acid-base deficit: 7 — ABNORMAL HIGH
Bicarbonate: 17.8 — ABNORMAL LOW
Bicarbonate: 20.5
Bicarbonate: 20.7
Bicarbonate: 22.6
Bicarbonate: 23.5
O2 Saturation: 100
O2 Saturation: 94
Operator id: 123161
Patient temperature: 36.3
Patient temperature: 37
Patient temperature: 37.2
TCO2: 19
TCO2: 22
TCO2: 22
TCO2: 24
TCO2: 25
pCO2 arterial: 39.9
pCO2 arterial: 43.3
pH, Arterial: 7.327 — ABNORMAL LOW
pH, Arterial: 7.372
pO2, Arterial: 270 — ABNORMAL HIGH
pO2, Arterial: 76 — ABNORMAL LOW
pO2, Arterial: 79 — ABNORMAL LOW

## 2011-04-30 LAB — CROSSMATCH: Antibody Screen: NEGATIVE

## 2011-04-30 LAB — HEPARIN LEVEL (UNFRACTIONATED)
Heparin Unfractionated: 0.37
Heparin Unfractionated: 0.47
Heparin Unfractionated: <0.1 — ABNORMAL LOW

## 2011-04-30 LAB — COMPREHENSIVE METABOLIC PANEL
ALT: 41
ALT: 23
AST: 23
Albumin: 3.6
Albumin: 3.7
Alkaline Phosphatase: 88
Alkaline Phosphatase: 69
Alkaline Phosphatase: 73
BUN: 10
BUN: 10
CO2: 27
Calcium: 9.4
Chloride: 105
Chloride: 102
Chloride: 106
GFR calc Af Amer: >60
GFR calc non Af Amer: >60
Glucose, Bld: 110 — ABNORMAL HIGH
Potassium: 3.9
Potassium: 4.1
Sodium: 140
Sodium: 137
Total Bilirubin: 0.7
Total Bilirubin: 0.9
Total Bilirubin: 0.9

## 2011-04-30 LAB — URINALYSIS, ROUTINE W REFLEX MICROSCOPIC
Nitrite: NEGATIVE
Specific Gravity, Urine: 1.029
Urobilinogen, UA: 0.2
pH: 5.5

## 2011-04-30 LAB — DIFFERENTIAL
Eosinophils Relative: 1
Lymphocytes Relative: 35
Lymphs Abs: 3.4 — ABNORMAL HIGH
Monocytes Absolute: 0.7

## 2011-04-30 LAB — CBC
HCT: 32 — ABNORMAL LOW
HCT: 32.4 — ABNORMAL LOW
HCT: 33.8 — ABNORMAL LOW
HCT: 36.5 — ABNORMAL LOW
HCT: 23.3 — ABNORMAL LOW
HCT: 28.6 — ABNORMAL LOW
HCT: 29.4 — ABNORMAL LOW
HCT: 31.1 — ABNORMAL LOW
HCT: 46.2
HCT: 46.8
HCT: 51.6
Hemoglobin: 10.6 — ABNORMAL LOW
Hemoglobin: 10.9 — ABNORMAL LOW
Hemoglobin: 15.7
Hemoglobin: 16.1
Hemoglobin: 17.6 — ABNORMAL HIGH
MCHC: 33.1
MCHC: 34
MCHC: 34.1
MCHC: 34.2
MCV: 96.1
MCV: 97.3
MCV: 97.8
MCV: 94.2
MCV: 95.7
MCV: 96.2
MCV: 96.3
Platelets: 575 — ABNORMAL HIGH
Platelets: 579 — ABNORMAL HIGH
Platelets: 108 — ABNORMAL LOW
Platelets: 114 — ABNORMAL LOW
Platelets: 120 — ABNORMAL LOW
Platelets: 132 — ABNORMAL LOW
Platelets: 202
RBC: 3.29 — ABNORMAL LOW
RBC: 3.38 — ABNORMAL LOW
RBC: 2.42 — ABNORMAL LOW
RDW: 13.5
RDW: 13.9
WBC: 12.2 — ABNORMAL HIGH
WBC: 12.5 — ABNORMAL HIGH
WBC: 9.3
WBC: 13.1 — ABNORMAL HIGH
WBC: 15.6 — ABNORMAL HIGH
WBC: 17.4 — ABNORMAL HIGH
WBC: 21 — ABNORMAL HIGH
WBC: 25.8 — ABNORMAL HIGH
WBC: 9.6

## 2011-04-30 LAB — POCT CARDIAC MARKERS
Myoglobin, poc: 84.1
Operator id: 4661
Troponin i, poc: <0.05

## 2011-04-30 LAB — CK TOTAL AND CKMB (NOT AT ARMC)
CK, MB: 24.4 — ABNORMAL HIGH
CK, MB: 7.8 — ABNORMAL HIGH
Relative Index: 7.7 — ABNORMAL HIGH
Relative Index: 8.4 — ABNORMAL HIGH
Relative Index: 2.6 — ABNORMAL HIGH
Total CK: 139
Total CK: 272 — ABNORMAL HIGH
Total CK: 196

## 2011-04-30 LAB — CARDIAC PANEL(CRET KIN+CKTOT+MB+TROPI)
CK, MB: 5.6 — ABNORMAL HIGH
Relative Index: 3.1 — ABNORMAL HIGH
Relative Index: 3.8 — ABNORMAL HIGH
Total CK: 187
Total CK: 180
Troponin I: 0.53
Troponin I: 0.93

## 2011-04-30 LAB — HEMOGLOBIN AND HEMATOCRIT, BLOOD: HCT: 30.6 — ABNORMAL LOW

## 2011-04-30 LAB — LIPID PANEL
Cholesterol: 221 — ABNORMAL HIGH
HDL: 19 — ABNORMAL LOW
LDL Cholesterol: 140 — ABNORMAL HIGH
Total CHOL/HDL Ratio: 11.6
Triglycerides: 308 — ABNORMAL HIGH

## 2011-04-30 LAB — POCT I-STAT 3, VENOUS BLOOD GAS (G3P V)
Acid-base deficit: 6 — ABNORMAL HIGH
Bicarbonate: 21.2
Operator id: 3291
pCO2, Ven: 51.1 — ABNORMAL HIGH
pO2, Ven: 49 — ABNORMAL HIGH

## 2011-04-30 LAB — I-STAT 8, (EC8 V) (CONVERTED LAB)
BUN: 16
Bicarbonate: 23.4
Chloride: 107
HCT: 37 — ABNORMAL LOW
Hemoglobin: 12.6 — ABNORMAL LOW
Operator id: 270111
Sodium: 137

## 2011-04-30 LAB — BLOOD GAS, ARTERIAL
Acid-base deficit: 3.1 — ABNORMAL HIGH
FIO2: 0.21
O2 Saturation: 97.9
Patient temperature: 98.6

## 2011-04-30 LAB — URINE CULTURE: Culture: NO GROWTH

## 2011-04-30 LAB — MAGNESIUM
Magnesium: 2.6 — ABNORMAL HIGH
Magnesium: 2.8 — ABNORMAL HIGH

## 2011-04-30 LAB — ABO/RH: ABO/RH(D): O POS

## 2011-04-30 LAB — PROTIME-INR
Prothrombin Time: 13.2
Prothrombin Time: 17 — ABNORMAL HIGH

## 2011-04-30 LAB — URINE MICROSCOPIC-ADD ON

## 2011-04-30 LAB — PLATELET COUNT: Platelets: 146 — ABNORMAL LOW

## 2011-09-15 ENCOUNTER — Emergency Department (HOSPITAL_BASED_OUTPATIENT_CLINIC_OR_DEPARTMENT_OTHER)
Admission: EM | Admit: 2011-09-15 | Discharge: 2011-09-15 | Disposition: A | Discharge status: Home / Self Care | Payer: Self-pay | Attending: Emergency Medicine | Admitting: Emergency Medicine

## 2011-09-15 ENCOUNTER — Encounter (HOSPITAL_BASED_OUTPATIENT_CLINIC_OR_DEPARTMENT_OTHER): Payer: Self-pay

## 2011-09-15 ENCOUNTER — Other Ambulatory Visit: Payer: Self-pay

## 2011-09-15 DIAGNOSIS — E785 Hyperlipidemia, unspecified: Secondary | ICD-10-CM | POA: Insufficient documentation

## 2011-09-15 DIAGNOSIS — R109 Unspecified abdominal pain: Secondary | ICD-10-CM | POA: Insufficient documentation

## 2011-09-15 DIAGNOSIS — R197 Diarrhea, unspecified: Secondary | ICD-10-CM | POA: Insufficient documentation

## 2011-09-15 DIAGNOSIS — K5289 Other specified noninfective gastroenteritis and colitis: Secondary | ICD-10-CM | POA: Insufficient documentation

## 2011-09-15 DIAGNOSIS — I2510 Atherosclerotic heart disease of native coronary artery without angina pectoris: Secondary | ICD-10-CM | POA: Insufficient documentation

## 2011-09-15 DIAGNOSIS — R112 Nausea with vomiting, unspecified: Secondary | ICD-10-CM | POA: Insufficient documentation

## 2011-09-15 DIAGNOSIS — K529 Noninfective gastroenteritis and colitis, unspecified: Secondary | ICD-10-CM

## 2011-09-15 DIAGNOSIS — I10 Essential (primary) hypertension: Secondary | ICD-10-CM | POA: Insufficient documentation

## 2011-09-15 HISTORY — DX: Acute pancreatitis without necrosis or infection, unspecified: K85.90

## 2011-09-15 LAB — CBC
HCT: 51.5 % (ref 39.0–52.0)
MCV: 93.8 fL (ref 78.0–100.0)
RDW: 13.3 % (ref 11.5–15.5)
WBC: 11.1 10*3/uL — ABNORMAL HIGH (ref 4.0–10.5)

## 2011-09-15 LAB — DIFFERENTIAL
Lymphocytes Relative: 23 % (ref 12–46)
Lymphs Abs: 2.5 10*3/uL (ref 0.7–4.0)
Monocytes Relative: 6 % (ref 3–12)
Neutro Abs: 7.8 10*3/uL — ABNORMAL HIGH (ref 1.7–7.7)
Neutrophils Relative %: 70 % (ref 43–77)

## 2011-09-15 LAB — COMPREHENSIVE METABOLIC PANEL
Alkaline Phosphatase: 71 U/L (ref 39–117)
BUN: 19 mg/dL (ref 6–23)
CO2: 22 mEq/L (ref 19–32)
Chloride: 102 mEq/L (ref 96–112)
GFR calc Af Amer: >90 mL/min (ref >90)
Glucose, Bld: 115 mg/dL — ABNORMAL HIGH (ref 70–99)
Potassium: 3.9 mEq/L (ref 3.5–5.1)
Total Bilirubin: 0.4 mg/dL (ref 0.3–1.2)

## 2011-09-15 LAB — LIPASE, BLOOD: Lipase: 36 U/L (ref 11–59)

## 2011-09-15 MED ORDER — MORPHINE SULFATE 4 MG/ML IJ SOLN
4.0000 mg | Freq: Once | INTRAMUSCULAR | Status: AC
  Administered 2011-09-15: 4 mg via INTRAVENOUS
  Filled 2011-09-15: qty 1

## 2011-09-15 MED ORDER — KETOROLAC TROMETHAMINE 30 MG/ML IJ SOLN
30.0000 mg | Freq: Once | INTRAMUSCULAR | Status: AC
  Administered 2011-09-15: 30 mg via INTRAVENOUS
  Filled 2011-09-15: qty 1

## 2011-09-15 MED ORDER — ONDANSETRON HCL 4 MG/2ML IJ SOLN
4.0000 mg | Freq: Once | INTRAMUSCULAR | Status: AC
  Administered 2011-09-15: 4 mg via INTRAVENOUS
  Filled 2011-09-15: qty 2

## 2011-09-15 MED ORDER — PANTOPRAZOLE SODIUM 40 MG IV SOLR
40.0000 mg | Freq: Once | INTRAVENOUS | Status: AC
  Administered 2011-09-15: 40 mg via INTRAVENOUS
  Filled 2011-09-15: qty 40

## 2011-09-15 MED ORDER — SODIUM CHLORIDE 0.9 % IV SOLN
Freq: Once | INTRAVENOUS | Status: AC
  Administered 2011-09-15: 12:00:00 via INTRAVENOUS

## 2011-09-15 NOTE — Discharge Instructions (Signed)
Viral Gastroenteritis Viral gastroenteritis is also known as stomach flu. This condition affects the stomach and intestinal tract. The illness typically lasts 3 to 8 days. Most people develop an immune response. This eventually gets rid of the virus. While this natural response develops, the virus can make you quite ill.  CAUSES  Diarrhea and vomiting are often caused by a virus. Medicines (antibiotics) that kill germs will not help unless there is also a germ (bacterial) infection. SYMPTOMS  The most common symptom is diarrhea. This can cause severe loss of fluids (dehydration) and body salt (electrolyte) imbalance. TREATMENT  Treatments for this illness are aimed at rehydration. Antidiarrheal medicines are not recommended. They do not decrease diarrhea volume and may be harmful. Usually, home treatment is all that is needed. The most serious cases involve vomiting so severely that you are not able to keep down fluids taken by mouth (orally). In these cases, intravenous (IV) fluids are needed. Vomiting with viral gastroenteritis is common, but it will usually go away with treatment. HOME CARE INSTRUCTIONS  Small amounts of fluids should be taken frequently. Large amounts at one time may not be tolerated. Plain water may be harmful in infants and the elderly. Oral rehydration solutions (ORS) are available at pharmacies and grocery stores. ORS replace water and important electrolytes in proper proportions. Sports drinks are not as effective as ORS and may be harmful due to sugars worsening diarrhea.  As a general guideline for children, replace any new fluid losses from diarrhea or vomiting with ORS as follows:   If your child weighs 22 pounds or under (10 kg or less), give 60-120 mL (1/4 - 1/2 cup or 2 - 4 ounces) of ORS for each diarrheal stool or vomiting episode.   If your child weighs more than 22 pounds (more than 10 kgs), give 120-240 mL (1/2 - 1 cup or 4 - 8 ounces) of ORS for each diarrheal  stool or vomiting episode.   In a child with vomiting, it may be helpful to give the above ORS replacement in 5 mL (1 teaspoon) amounts every 5 minutes, then increase as tolerated.   While correcting for dehydration, children should eat normally. However, foods high in sugar should be avoided because this may worsen diarrhea. Large amounts of carbonated soft drinks, juice, gelatin desserts, and other highly sugared drinks should be avoided.   After correction of dehydration, other liquids that are appealing to the child may be added. Children should drink small amounts of fluids frequently and fluids should be increased as tolerated.   Adults should eat normally while drinking more fluids than usual. Drink small amounts of fluids frequently and increase as tolerated. Drink enough water and fluids to keep your urine clear or pale yellow. Broths, weak decaffeinated tea, lemon-lime soft drinks (allowed to go flat), and ORS replace fluids and electrolytes.   Avoid:   Carbonated drinks.   Juice.   Extremely hot or cold fluids.   Caffeine drinks.   Fatty, greasy foods.   Alcohol.   Tobacco.   Too much intake of anything at one time.   Gelatin desserts.   Probiotics are active cultures of beneficial bacteria. They may lessen the amount and number of diarrheal stools in adults. Probiotics can be found in yogurt with active cultures and in supplements.   Wash your hands well to avoid spreading bacteria and viruses.   Antidiarrheal medicines are not recommended for infants and children.   Only take over-the-counter or prescription medicines for   pain, discomfort, or fever as directed by your caregiver. Do not give aspirin to children.   For adults with dehydration, ask your caregiver if you should continue all prescribed and over-the-counter medicines.   If your caregiver has given you a follow-up appointment, it is very important to keep that appointment. Not keeping the appointment  could result in a lasting (chronic) or permanent injury and disability. If there is any problem keeping the appointment, you must call to reschedule.  SEEK IMMEDIATE MEDICAL CARE IF:   You are unable to keep fluids down.   There is no urine output in 6 to 8 hours or there is only a small amount of very dark urine.   You develop shortness of breath.   There is blood in the vomit (may look like coffee grounds) or stool.   Belly (abdominal) pain develops, increases, or localizes.   There is persistent vomiting or diarrhea.   You have a fever.   Your baby is older than 3 months with a rectal temperature of 102 F (38.9 C) or higher.   Your baby is 3 months old or younger with a rectal temperature of 100.4 F (38 C) or higher.  MAKE SURE YOU:   Understand these instructions.   Will watch your condition.   Will get help right away if you are not doing well or get worse.  Document Released: 07/06/2005 Document Revised: 03/18/2011 Document Reviewed: 11/17/2006 [employer] Patient Information 2012 [employer]. 

## 2011-09-15 NOTE — ED Notes (Signed)
Pt reports he has had abdominal pain, vomiting and diarrhea x 2 days.

## 2011-09-15 NOTE — ED Notes (Signed)
MD at bedside. 

## 2011-09-15 NOTE — ED Notes (Signed)
First liter NS infused and second liter NS initiated at 967ml/hr.

## 2011-09-15 NOTE — ED Provider Notes (Addendum)
History     CSN: [id number]  Arrival date & time 09/15/11  1042   First MD Initiated Contact with Patient 09/15/11 1128      Chief Complaint  Patient presents with  . Abdominal Pain  . Emesis  . Diarrhea    (Consider location/radiation/quality/duration/timing/severity/associated sxs/prior treatment) Patient is a 46 y.o. male presenting with abdominal pain, vomiting, and diarrhea. The history is provided by the patient.  Abdominal Pain The primary symptoms of the illness include abdominal pain, fatigue, nausea, vomiting and diarrhea. The primary symptoms of the illness do not include fever. Episode onset: 3-4 days ago. The onset of the illness was sudden. The problem has been gradually worsening.  Associated with: nothing. The patient has had a change in bowel habit. Additional symptoms associated with the illness include chills. Symptoms associated with the illness do not include urgency or frequency. Significant associated medical issues include cardiac disease.  Emesis  Associated symptoms include abdominal pain, chills and diarrhea. Pertinent negatives include no fever.  Diarrhea The primary symptoms include fatigue, abdominal pain, nausea, vomiting and diarrhea. Primary symptoms do not include fever.  The illness is also significant for chills.    Past Medical History  Diagnosis Date  . CAD (coronary artery disease)   . HLD (hyperlipidemia)   . Peripheral neuropathy   . HTN (hypertension)     Past Surgical History  Procedure Date  . Coronary artery bypass graft Sept 2008    left internal mammary artery to left anterior descending artery, free right internal mammary artery to obtuse marg #1, saphenous vein graft to 1st diagonal, saphen vein graft sequentially to acute marg and posterior descending   . Laminectomy 1992    L5-S1    Family History  Problem Relation Age of Onset  . Coronary artery disease Father 19    History  Substance Use Topics  . Smoking status:  Former Games developer  . Smokeless tobacco: Never Used  . Alcohol Use: No      Review of Systems  Constitutional: Positive for chills and fatigue. Negative for fever.  Gastrointestinal: Positive for nausea, vomiting, abdominal pain and diarrhea.  Genitourinary: Negative for urgency and frequency.  All other systems reviewed and are negative.    Allergies  Review of patient's allergies indicates no known allergies.  Home Medications   Current Outpatient Rx  Name Route Sig Dispense Refill  . ASPIRIN 81 MG PO TABS Oral Take 162 mg by mouth daily.     Marland Kitchen GABAPENTIN 300 MG PO CAPS Oral Take 1,200 mg by mouth 2 (two) times daily.      Marland Kitchen GARLIC POWD Does not apply by Does not apply route daily.      Marland Kitchen LISINOPRIL 5 MG PO TABS Oral Take 5 mg by mouth daily.      . MULTIVITAMINS PO TABS Oral Take 1 tablet by mouth daily.      Marland Kitchen FISH OIL 1000 MG PO CAPS Oral Take by mouth 2 (two) times daily.      Marland Kitchen ROSUVASTATIN CALCIUM 20 MG PO TABS Oral Take 20 mg by mouth daily.        BP 175/93  Pulse 90  Temp(Src) 97.5 F (36.4 C) (Oral)  Resp 18  Ht 5\' 11"  (1.803 m)  Wt 204 lb (92.534 kg)  BMI 28.45 kg/m2  SpO2 100%  Physical Exam  Nursing note and vitals reviewed. Constitutional: He is oriented to person, place, and time. He appears well-developed and well-nourished. No distress.  HENT:  Head: Normocephalic and atraumatic.  Mouth/Throat: No oropharyngeal exudate.       Mucous membranes somewhat dry.  Neck: Normal range of motion. Neck supple.  Cardiovascular: Normal rate and regular rhythm.   No murmur heard. Pulmonary/Chest: Effort normal and breath sounds normal. No respiratory distress. He has no wheezes.  Abdominal: Soft. Bowel sounds are normal. He exhibits no distension. There is no tenderness. There is no rebound and no guarding.  Musculoskeletal: Normal range of motion. He exhibits no edema.  Lymphadenopathy:    He has no cervical adenopathy.  Neurological: He is alert and oriented  to person, place, and time.  Skin: Skin is warm and dry. He is not diaphoretic.    ED Course  Procedures (including critical care time)   Labs Reviewed  CBC  DIFFERENTIAL  COMPREHENSIVE METABOLIC PANEL  LIPASE, BLOOD   No results found.   No diagnosis found.   Date: 09/15/2011  Rate: 97  Rhythm: normal sinus rhythm  QRS Axis: normal  Intervals: normal  ST/T Wave abnormalities: normal  Conduction Disutrbances:none  Narrative Interpretation:   Old EKG Reviewed: unchanged    MDM  Feels better with protonix, fluids.  Labs okay.  Will discharge to home.        Geoffery Lyons, MD 09/15/11 1330  Geoffery Lyons, MD 09/15/11 1350

## 2012-02-05 ENCOUNTER — Emergency Department (HOSPITAL_COMMUNITY)
Admission: EM | Admit: 2012-02-05 | Discharge: 2012-02-05 | Disposition: A | Discharge status: Home / Self Care | Payer: Self-pay | Attending: Emergency Medicine | Admitting: Emergency Medicine

## 2012-02-05 ENCOUNTER — Encounter (HOSPITAL_COMMUNITY): Payer: Self-pay | Admitting: Emergency Medicine

## 2012-02-05 DIAGNOSIS — H6120 Impacted cerumen, unspecified ear: Secondary | ICD-10-CM | POA: Insufficient documentation

## 2012-02-05 DIAGNOSIS — Z87891 Personal history of nicotine dependence: Secondary | ICD-10-CM | POA: Insufficient documentation

## 2012-02-05 DIAGNOSIS — G609 Hereditary and idiopathic neuropathy, unspecified: Secondary | ICD-10-CM | POA: Insufficient documentation

## 2012-02-05 DIAGNOSIS — H6090 Unspecified otitis externa, unspecified ear: Secondary | ICD-10-CM

## 2012-02-05 DIAGNOSIS — I2510 Atherosclerotic heart disease of native coronary artery without angina pectoris: Secondary | ICD-10-CM | POA: Insufficient documentation

## 2012-02-05 DIAGNOSIS — Z880 Allergy status to penicillin: Secondary | ICD-10-CM | POA: Insufficient documentation

## 2012-02-05 DIAGNOSIS — H60399 Other infective otitis externa, unspecified ear: Secondary | ICD-10-CM | POA: Insufficient documentation

## 2012-02-05 DIAGNOSIS — Z951 Presence of aortocoronary bypass graft: Secondary | ICD-10-CM | POA: Insufficient documentation

## 2012-02-05 DIAGNOSIS — I10 Essential (primary) hypertension: Secondary | ICD-10-CM | POA: Insufficient documentation

## 2012-02-05 DIAGNOSIS — E785 Hyperlipidemia, unspecified: Secondary | ICD-10-CM | POA: Insufficient documentation

## 2012-02-05 MED ORDER — LIDOCAINE HCL 2 % EX GEL
CUTANEOUS | Status: AC
  Filled 2012-02-05: qty 10

## 2012-02-05 MED ORDER — OFLOXACIN 0.3 % OT SOLN: 10.0000 [drp] | Freq: Two times a day (BID) | OTIC | Status: AC

## 2012-02-05 MED ORDER — OXYCODONE-ACETAMINOPHEN 5-325 MG PO TABS
1.0000 | ORAL_TABLET | Freq: Once | ORAL | Status: AC
  Administered 2012-02-05: 1 via ORAL
  Filled 2012-02-05: qty 1

## 2012-02-05 MED ORDER — LIDOCAINE VISCOUS 2 % MT SOLN
20.0000 mL | Freq: Once | OROMUCOSAL | Status: DC
  Filled 2012-02-05: qty 20

## 2012-02-05 NOTE — ED Provider Notes (Signed)
History     CSN: [id number]  Arrival date & time 02/05/12  1749   First MD Initiated Contact with Patient 02/05/12 1810      Chief Complaint  Patient presents with  . Otalgia    (Consider location/radiation/quality/duration/timing/severity/associated sxs/prior treatment) Patient is a 46 y.o. male presenting with ear pain. The history is provided by the patient.  Otalgia This is a new problem. The current episode started 2 days ago (after swimming ). There is pain in the left ear. The problem occurs constantly. The problem has been gradually worsening. There has been no fever. The pain is at a severity of 6/10. The pain is moderate. Pertinent negatives include no ear discharge, no headaches, no hearing loss, no rhinorrhea, no sore throat, no abdominal pain, no diarrhea, no vomiting, no neck pain, no cough and no rash. His past medical history is significant for hearing loss. His past medical history does not include chronic ear infection.    Past Medical History  Diagnosis Date  . CAD (coronary artery disease)   . HLD (hyperlipidemia)   . Peripheral neuropathy   . HTN (hypertension)   . Pancreatitis     Past Surgical History  Procedure Date  . Coronary artery bypass graft Sept 2008    left internal mammary artery to left anterior descending artery, free right internal mammary artery to obtuse marg #1, saphenous vein graft to 1st diagonal, saphen vein graft sequentially to acute marg and posterior descending   . Laminectomy 1992    L5-S1    Family History  Problem Relation Age of Onset  . Coronary artery disease Father 41    History  Substance Use Topics  . Smoking status: Former Games developer  . Smokeless tobacco: Never Used  . Alcohol Use: No      Review of Systems  HENT: Positive for ear pain. Negative for hearing loss, sore throat, rhinorrhea, neck pain and ear discharge.   Respiratory: Negative for cough.   Gastrointestinal: Negative for vomiting, abdominal pain and  diarrhea.  Skin: Negative for rash.  Neurological: Negative for headaches.  All other systems reviewed and are negative.    Allergies  Penicillins  Home Medications   Current Outpatient Rx  Name Route Sig Dispense Refill  . ASPIRIN 81 MG PO TABS Oral Take 162 mg by mouth daily.     Marland Kitchen GABAPENTIN 300 MG PO CAPS Oral Take 600 mg by mouth 2 (two) times daily.     . MULTIVITAMINS PO TABS Oral Take 1 tablet by mouth daily.      Marland Kitchen FISH OIL 1000 MG PO CAPS Oral Take by mouth 2 (two) times daily.        BP 144/87  Pulse 74  Temp 97.7 F (36.5 C) (Oral)  Resp 20  Wt 208 lb (94.348 kg)  SpO2 99%  Physical Exam  Nursing note and vitals reviewed. Constitutional: He is oriented to person, place, and time. He appears well-developed and well-nourished. No distress.  HENT:  Head: Normocephalic and atraumatic.       Left TM and ear canal occluded by cerumen    Eyes: Conjunctivae and EOM are normal.  Neck: Normal range of motion.  Pulmonary/Chest: Effort normal.  Musculoskeletal: Normal range of motion.  Neurological: He is alert and oriented to person, place, and time.  Skin: Skin is warm and dry. No rash noted. He is not diaphoretic.  Psychiatric: He has a normal mood and affect. His behavior is normal.    ED  Course  FOREIGN BODY REMOVAL Date/Time: 02/05/2012 8:08 PM Performed by: Jaci Carrel Authorized by: Jaci Carrel Consent: Verbal consent obtained. Risks and benefits: risks, benefits and alternatives were discussed Consent given by: patient Patient understanding: patient states understanding of the procedure being performed Patient consent: the patient's understanding of the procedure matches consent given Patient identity confirmed: verbally with patient Time out: Immediately prior to procedure a "time out" was called to verify the correct patient, procedure, equipment, support staff and site/side marked as required. Body area: ear Anesthesia: local infiltration Local  anesthetic: viscour lidocaine. Patient sedated: no Patient restrained: no Patient cooperative: yes Localization method: ENT speculum Removal mechanism: curette and ear scoop Complexity: simple 1 objects recovered. Objects recovered: large cerumen  Post-procedure assessment: foreign body removed Patient tolerance: Patient tolerated the procedure well with no immediate complications.   (including critical care time)  Labs Reviewed - No data to display No results found.   No diagnosis found.    MDM  Otitis externa   cerumen plug removal    At this time there does not appear to be any evidence of an acute emergency medical condition and the patient appears stable for discharge with appropriate outpatient follow up.Diagnosis was discussed with patient who verbalizes understanding and is agreeable to discharge.        Jaci Carrel, [place] 02/05/12 2011

## 2012-02-06 NOTE — ED Provider Notes (Signed)
Medical screening examination/treatment/procedure(s) were performed by non-physician practitioner and as supervising physician I was immediately available for consultation/collaboration.   Lindsea Olivar Y. Patty Lopezgarcia, MD 02/06/12 1859 

## 2013-02-22 IMAGING — CT CT ABD-PELV W/ CM
2 of 5 series · 16 of 46 positions shown, 18 images · IV contrast (omnipaque)
Comparison: MRI of the lumbar spine performed 05/20/2007

CLINICAL DATA: Left-sided abdominal pain, fever, nausea, vomiting
and diarrhea.

CT ABDOMEN AND PELVIS WITH CONTRAST
TECHNIQUE: Multidetector CT imaging of the abdomen and pelvis was
performed following the standard protocol during bolus
administration of intravenous contrast.
Contrast: 100 mL of Omnipaque 300 IV contrast

[Series 2: abd/pelv with 5.0 b31f st · axial · 0.71mm/px · z∈[-66,+364]mm · 13 of 96 slices shown, 15 images]
[im 5/96  soft-tissue]
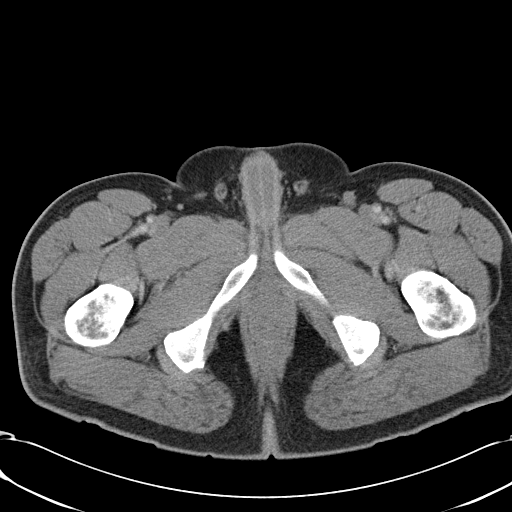
[im 5/96  bone]
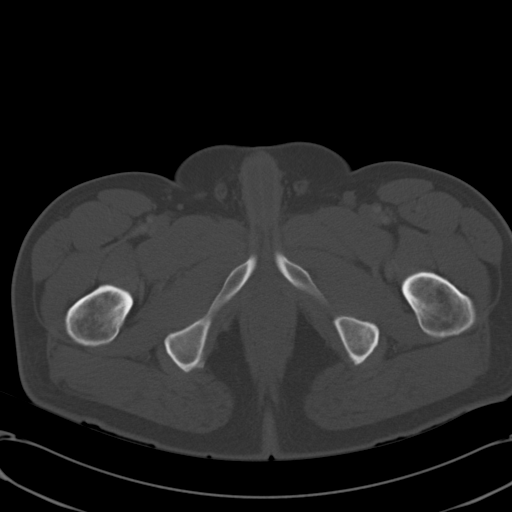
[im 15/96  soft-tissue]
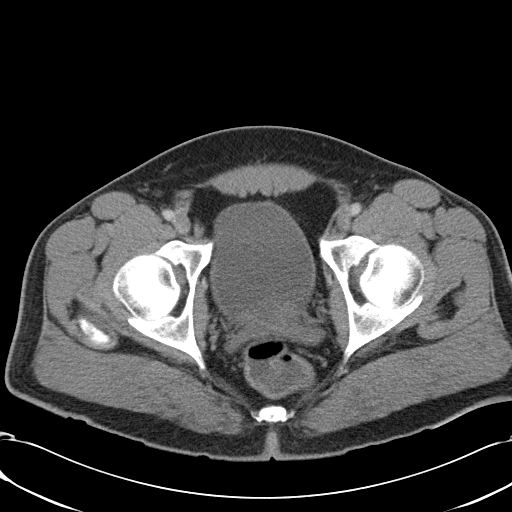
[im 20/96  soft-tissue]
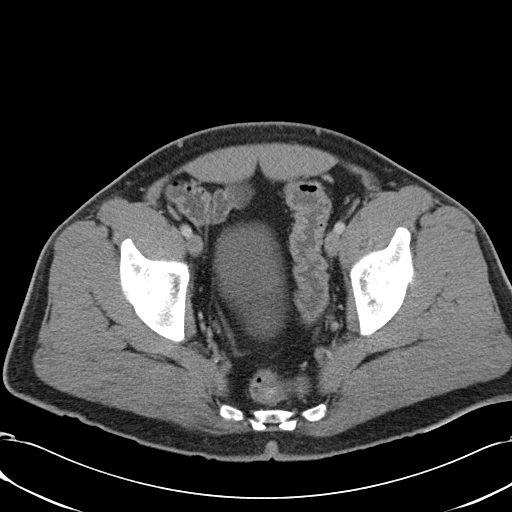
[im 29/96  soft-tissue]
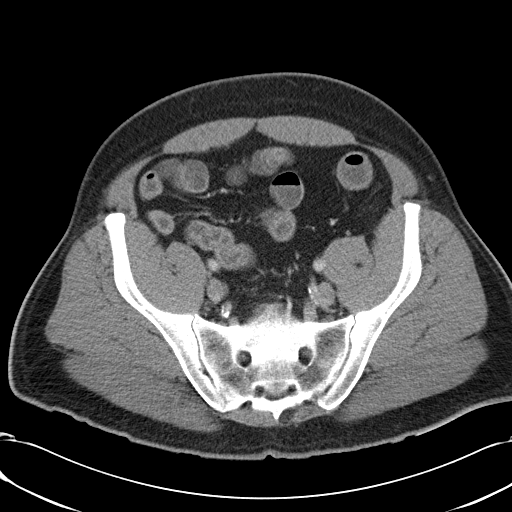
[im 34/96  soft-tissue]
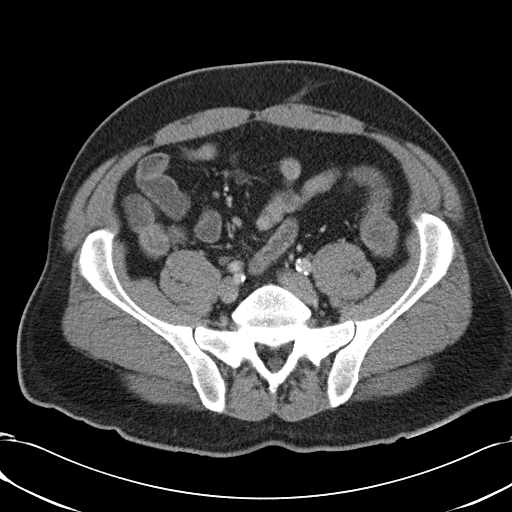
[im 43/96  soft-tissue]
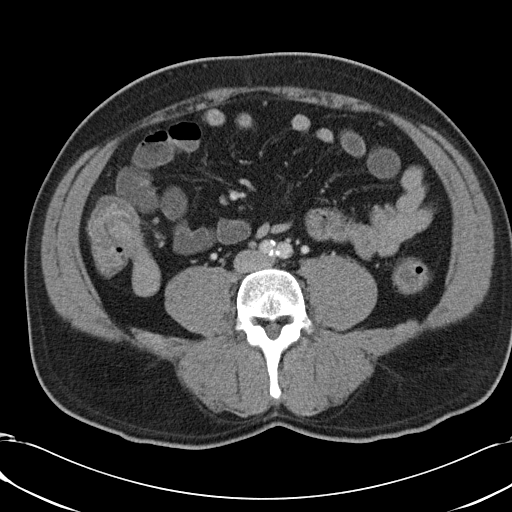
[im 48/96  soft-tissue]
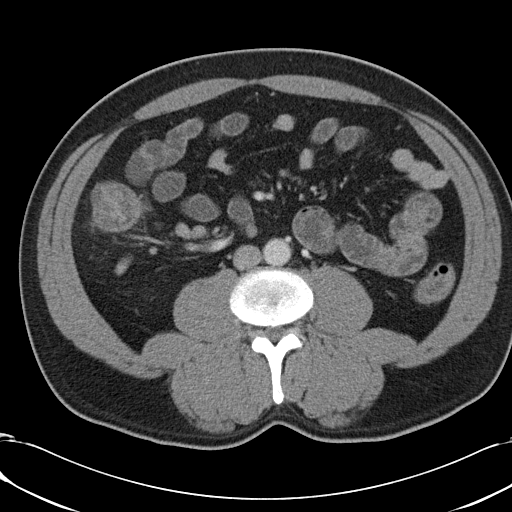
[im 53/96  soft-tissue]
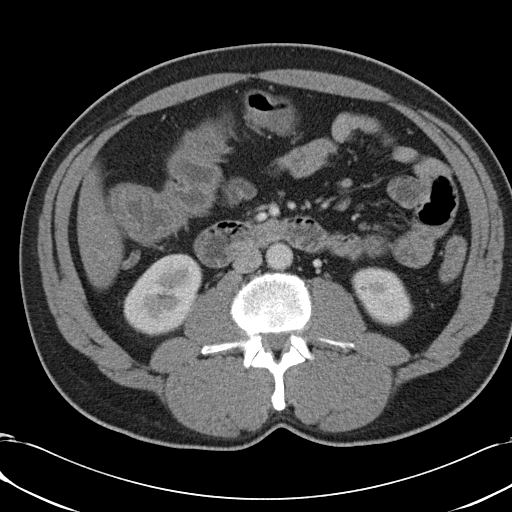
[im 62/96  soft-tissue]
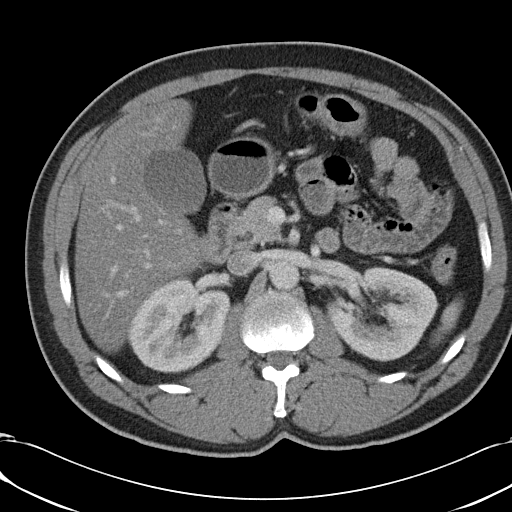
[im 62/96  bone]
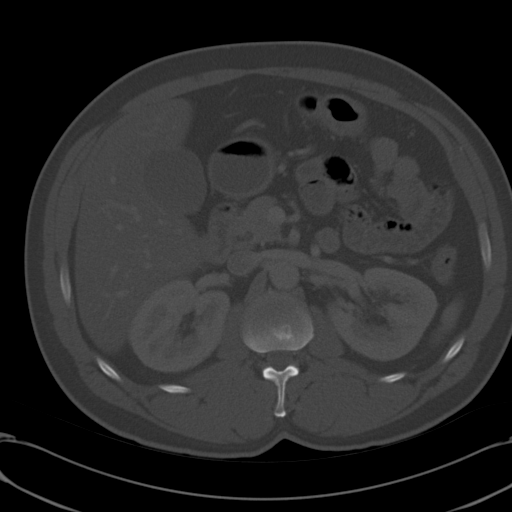
[im 67/96  soft-tissue]
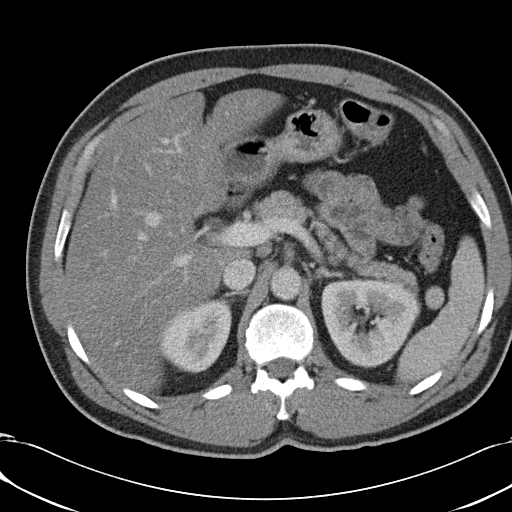
[im 77/96  soft-tissue]
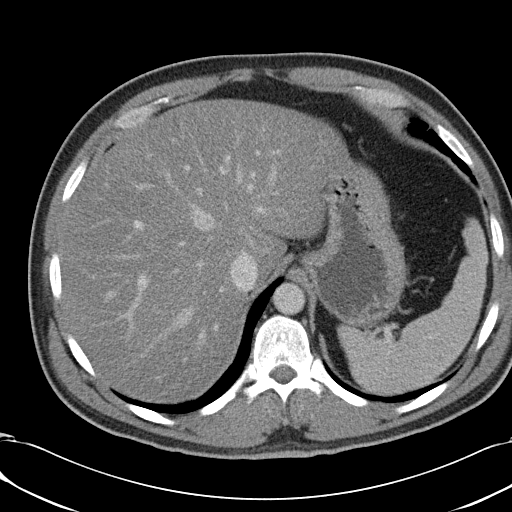
[im 81/96  soft-tissue]
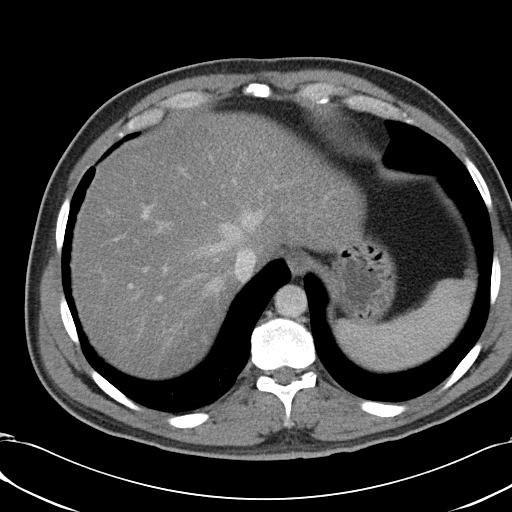
[im 91/96  soft-tissue]
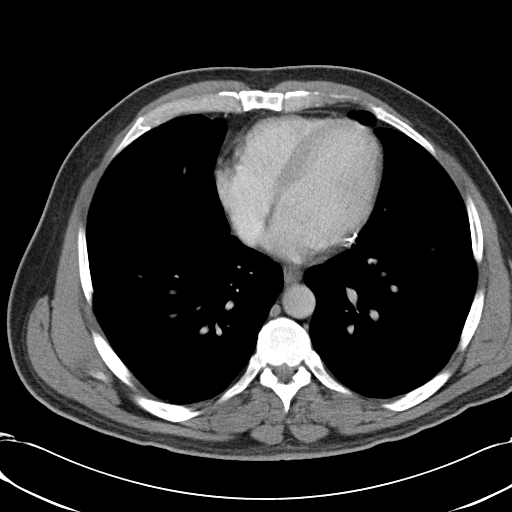

[Series 602: <mpr range> · coronal · 0.90mm/px · 3 of 126 slices shown]
[im 42/126  soft-tissue]
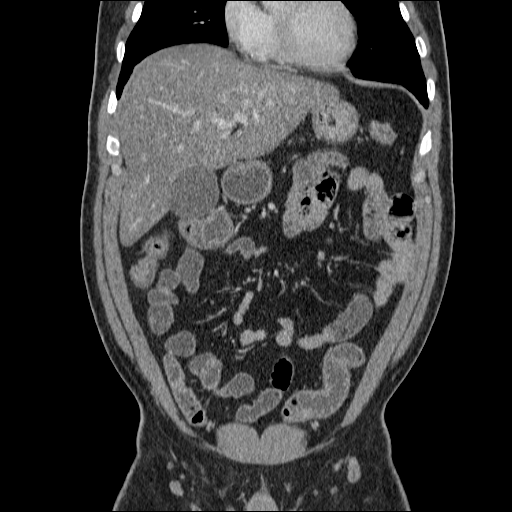
[im 56/126  soft-tissue]
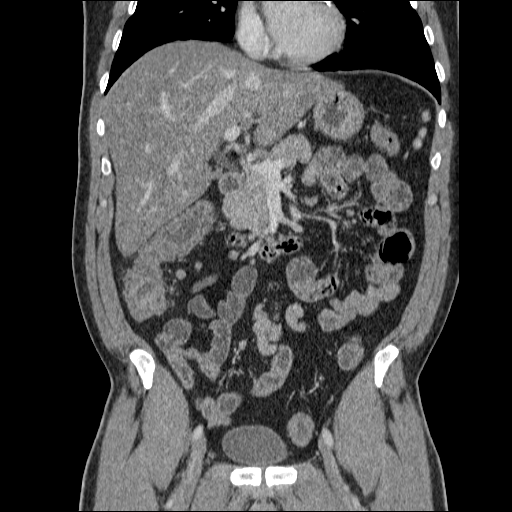
[im 70/126  soft-tissue]
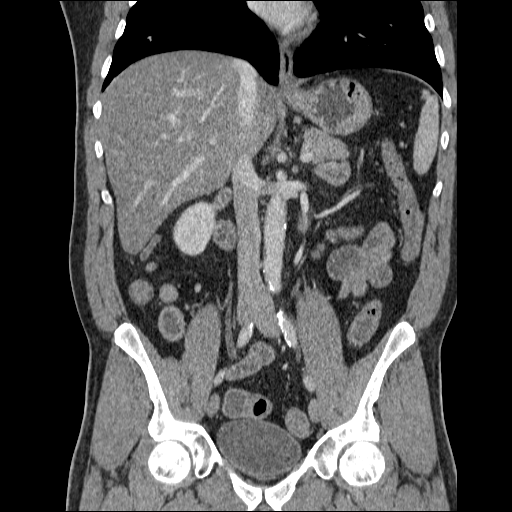

[16 of 46 positions shown; findings below may reference images not displayed]

FINDINGS: The visualized lung bases are clear.  Diffuse coronary
artery calcifications are seen.

Diffuse fatty infiltration is noted within the liver, with mild
sparing about the gallbladder fossa.  A small 0.9 cm hypodensity at
the anterior spleen may reflect a cyst; the spleen is otherwise
unremarkable.  The gallbladder is within normal limits.  The
pancreas and adrenal glands are unremarkable.

A 1.1 cm hypodensity at the interpole region of the left kidney
likely reflects a cyst.  The kidneys are otherwise unremarkable in
appearance.  There is no evidence of hydronephrosis.  No renal or
ureteral stones are seen.  No perinephric stranding is appreciated.

No free fluid is identified.  The small bowel is unremarkable in
appearance.  The stomach is within normal limits.  No acute
vascular abnormalities are seen.  Scattered calcification is noted
along the distal abdominal aorta and its branches.

The appendix is normal in caliber and contains air, without
evidence for appendicitis. There is mild apparent wall thickening
along the ascending and proximal transverse colon, though this may
be artifactual in nature.  The colon is otherwise unremarkable in
appearance.

The bladder is mildly distended and unremarkable.  The prostate is
mildly enlarged, measuring 5.1 cm in transverse dimension.  No
inguinal lymphadenopathy is seen.

No acute osseous abnormalities are identified.
IMPRESSION: 1.  Apparent mild wall thickening along the ascending and proximal
transverse colon could reflect a mild infectious or inflammatory
process; alternatively, it could remain within normal limits.
2.  Diffuse fatty infiltration within the liver.
3.  Diffuse coronary artery calcifications seen.
4.  Likely small splenic cyst and left renal cyst.
5.  Scattered calcification along the distal abdominal aorta and
its branches.
6.  Mildly enlarged prostate.

## 2013-02-22 IMAGING — CR DG CHEST 2V
2 series · 2 of 2 positions shown · non-contrast
Comparison: 03/08/2008

CLINICAL DATA: Shortness of breath and vomiting.  History of
hypertension and CABG.

CHEST - 2 VIEW

[w chest pa]
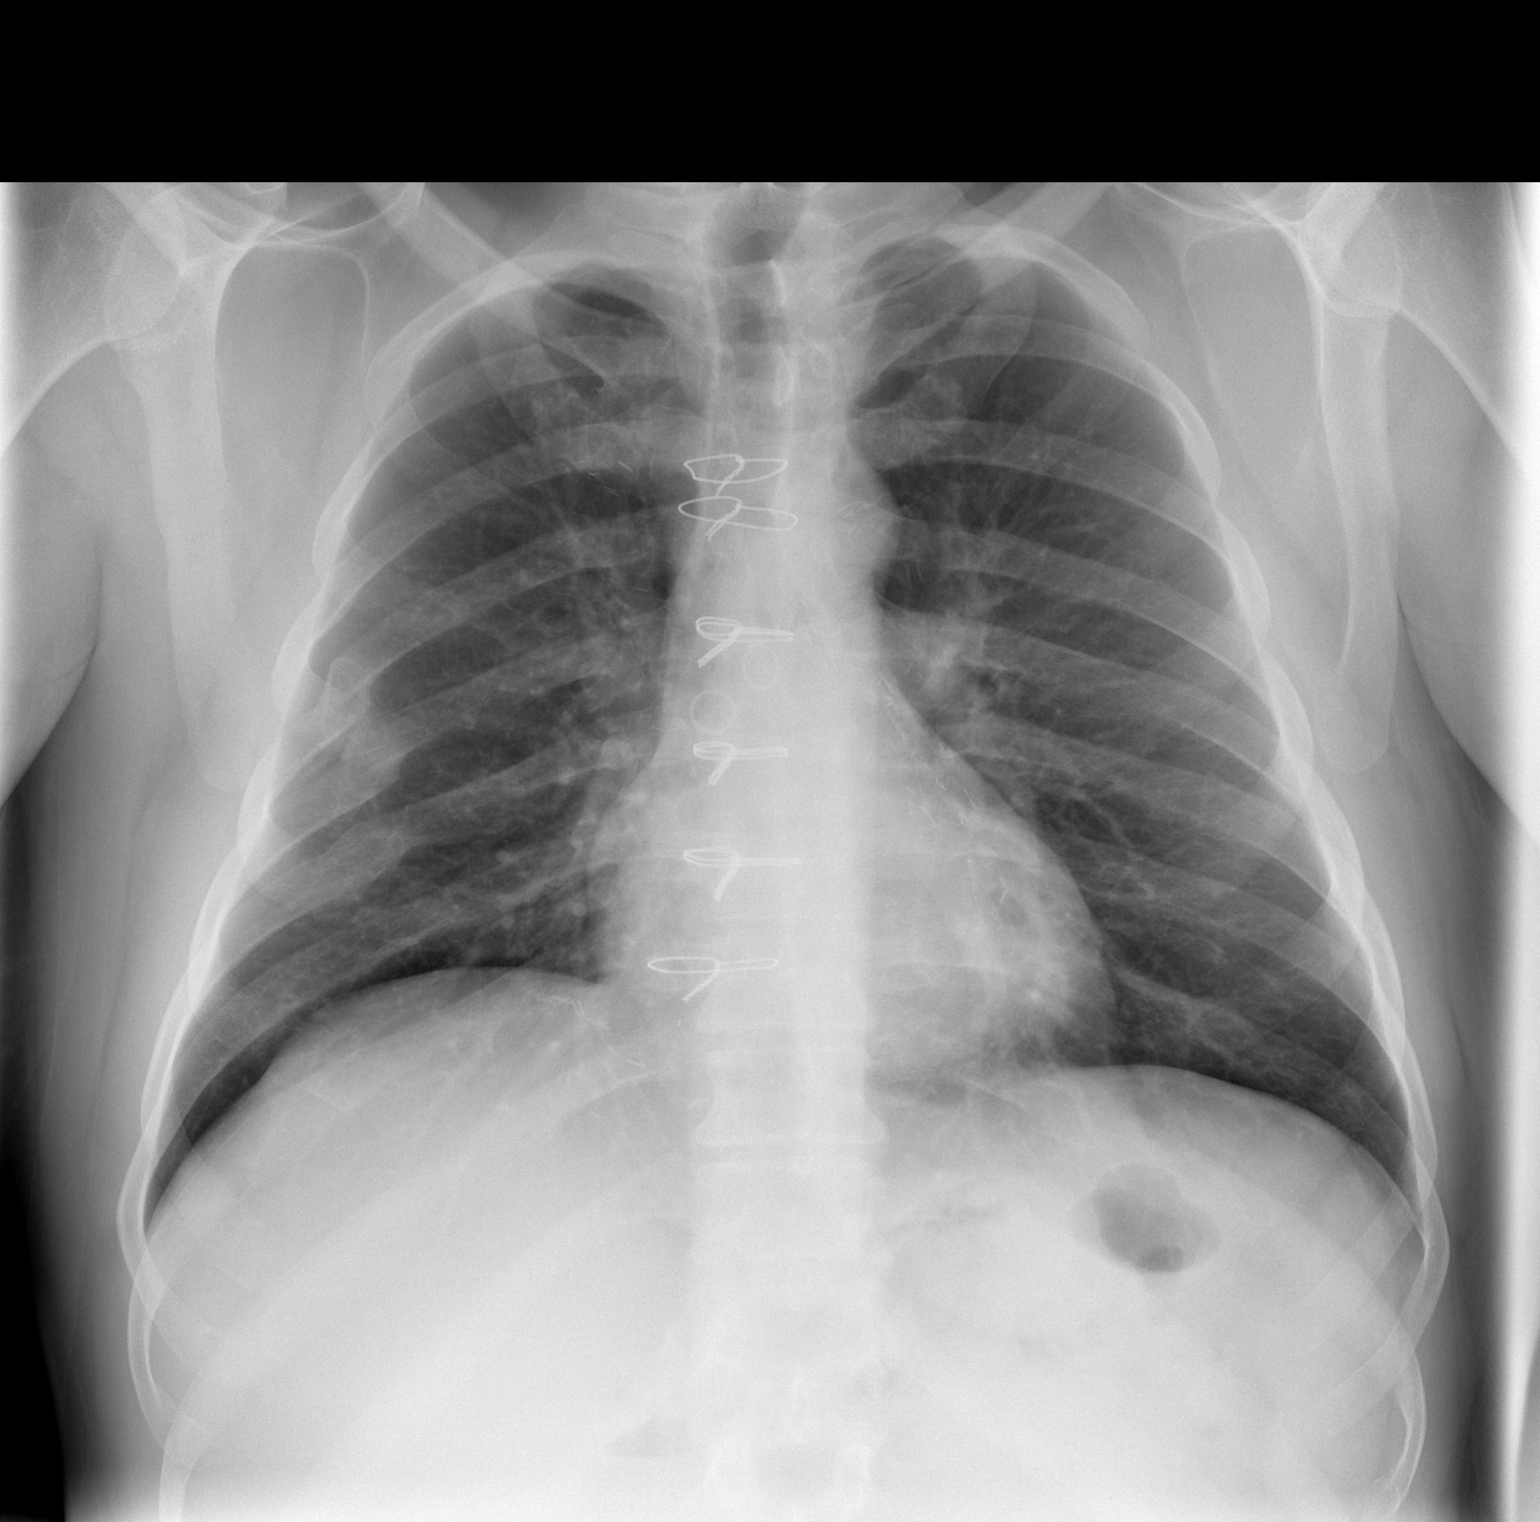

[w chest lat]
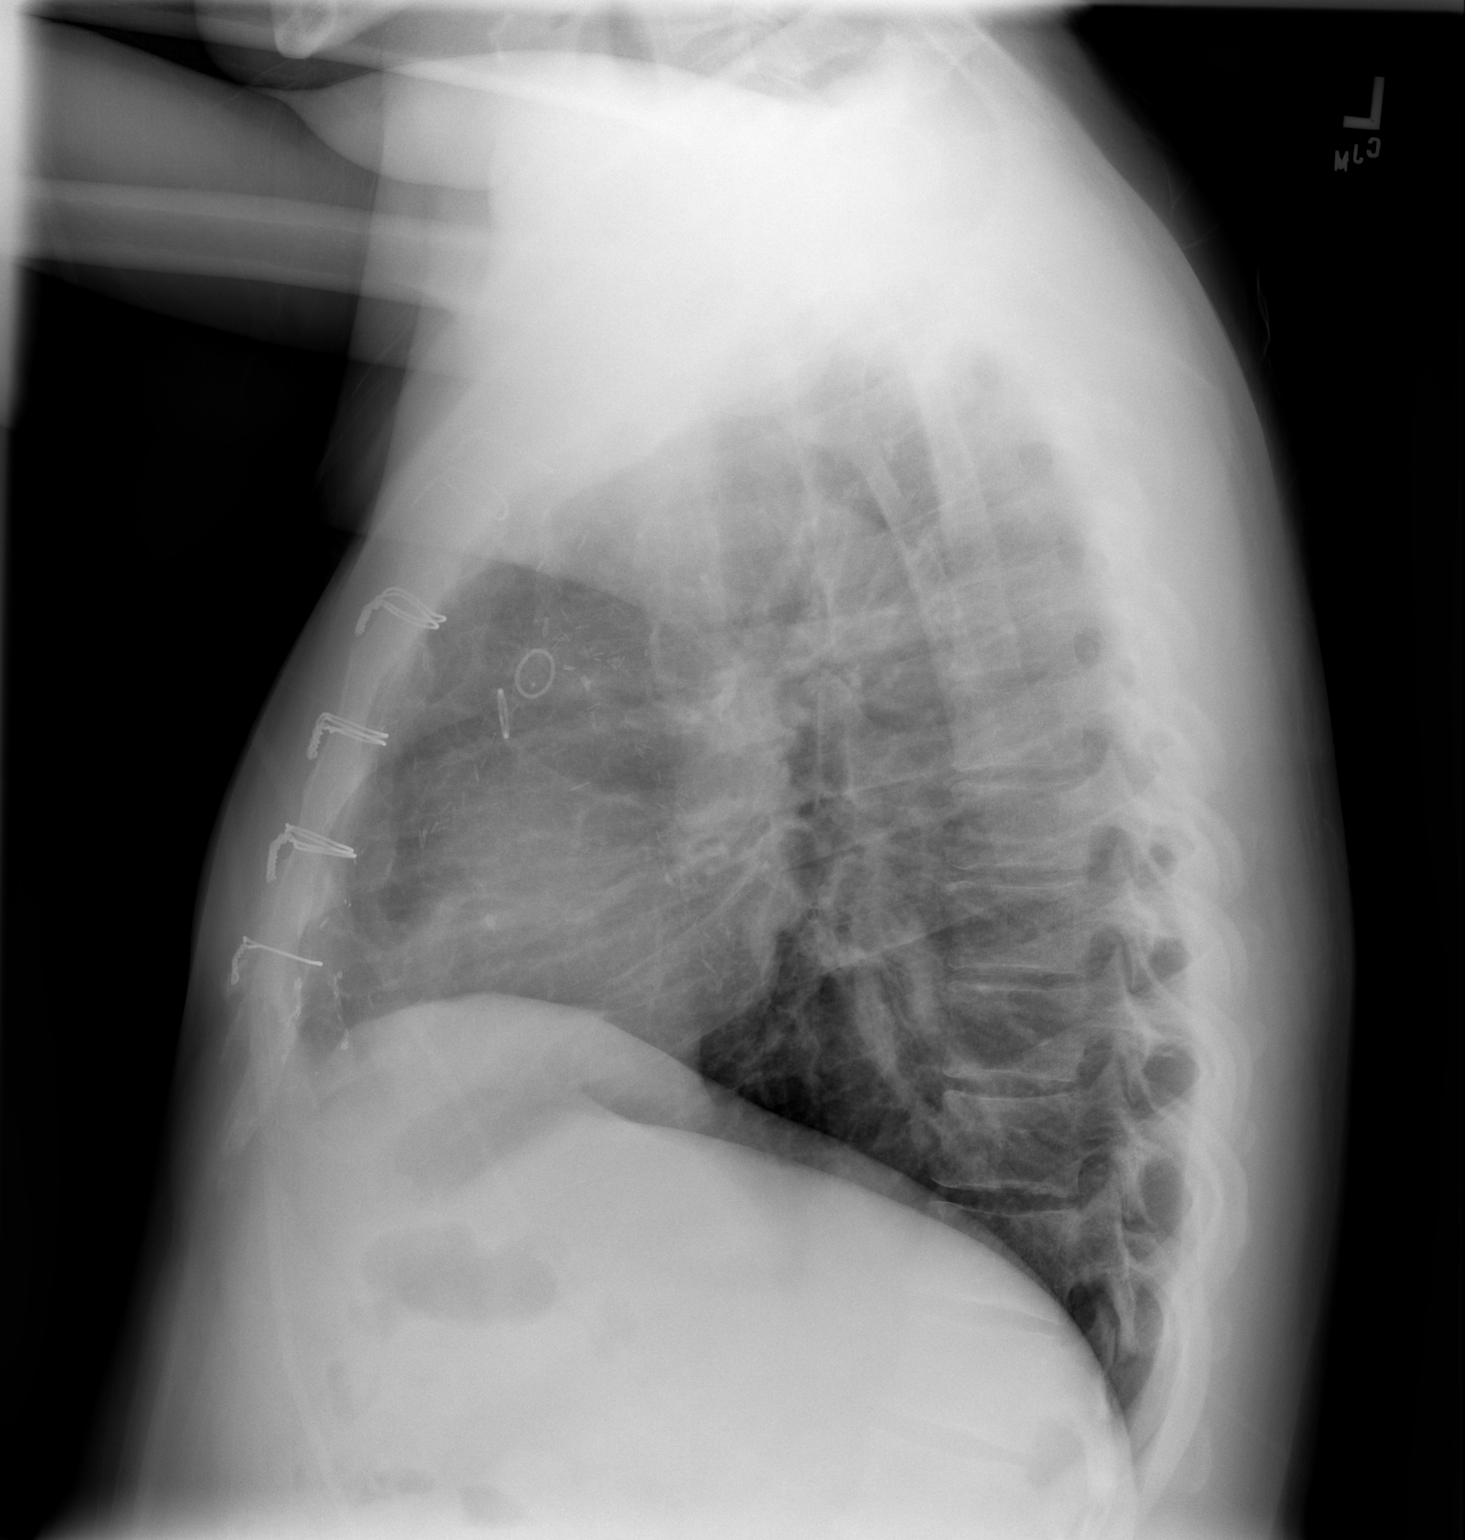

[2 of 2 positions shown; findings below may reference images not displayed]

FINDINGS: The heart size is normal status post prior CABG.  There
is no evidence of edema, infiltrate or pleural effusion.  Healed
rib fractures are noted on the right.
IMPRESSION: No active disease.

## 2013-03-08 ENCOUNTER — Emergency Department (HOSPITAL_BASED_OUTPATIENT_CLINIC_OR_DEPARTMENT_OTHER)
Admission: EM | Admit: 2013-03-08 | Discharge: 2013-03-08 | Disposition: A | Discharge status: Home / Self Care | Payer: Self-pay | Attending: Emergency Medicine | Admitting: Emergency Medicine

## 2013-03-08 ENCOUNTER — Encounter (HOSPITAL_BASED_OUTPATIENT_CLINIC_OR_DEPARTMENT_OTHER): Payer: Self-pay | Admitting: *Deleted

## 2013-03-08 DIAGNOSIS — X038XXA Other exposure to controlled fire, not in building or structure, initial encounter: Secondary | ICD-10-CM | POA: Insufficient documentation

## 2013-03-08 DIAGNOSIS — Y929 Unspecified place or not applicable: Secondary | ICD-10-CM | POA: Insufficient documentation

## 2013-03-08 DIAGNOSIS — Z8719 Personal history of other diseases of the digestive system: Secondary | ICD-10-CM | POA: Insufficient documentation

## 2013-03-08 DIAGNOSIS — T310 Burns involving less than 10% of body surface: Secondary | ICD-10-CM

## 2013-03-08 DIAGNOSIS — Y9389 Activity, other specified: Secondary | ICD-10-CM | POA: Insufficient documentation

## 2013-03-08 DIAGNOSIS — Z862 Personal history of diseases of the blood and blood-forming organs and certain disorders involving the immune mechanism: Secondary | ICD-10-CM | POA: Insufficient documentation

## 2013-03-08 DIAGNOSIS — Z7982 Long term (current) use of aspirin: Secondary | ICD-10-CM | POA: Insufficient documentation

## 2013-03-08 DIAGNOSIS — Z8669 Personal history of other diseases of the nervous system and sense organs: Secondary | ICD-10-CM | POA: Insufficient documentation

## 2013-03-08 DIAGNOSIS — I10 Essential (primary) hypertension: Secondary | ICD-10-CM | POA: Insufficient documentation

## 2013-03-08 DIAGNOSIS — Z79899 Other long term (current) drug therapy: Secondary | ICD-10-CM | POA: Insufficient documentation

## 2013-03-08 DIAGNOSIS — I2510 Atherosclerotic heart disease of native coronary artery without angina pectoris: Secondary | ICD-10-CM | POA: Insufficient documentation

## 2013-03-08 DIAGNOSIS — Z87891 Personal history of nicotine dependence: Secondary | ICD-10-CM | POA: Insufficient documentation

## 2013-03-08 DIAGNOSIS — Z880 Allergy status to penicillin: Secondary | ICD-10-CM | POA: Insufficient documentation

## 2013-03-08 DIAGNOSIS — T25229A Burn of second degree of unspecified foot, initial encounter: Secondary | ICD-10-CM | POA: Insufficient documentation

## 2013-03-08 DIAGNOSIS — Z8639 Personal history of other endocrine, nutritional and metabolic disease: Secondary | ICD-10-CM | POA: Insufficient documentation

## 2013-03-08 DIAGNOSIS — Z951 Presence of aortocoronary bypass graft: Secondary | ICD-10-CM | POA: Insufficient documentation

## 2013-03-08 MED ORDER — OXYCODONE-ACETAMINOPHEN 5-325 MG PO TABS
ORAL_TABLET | ORAL | Status: AC
  Filled 2013-03-08: qty 2

## 2013-03-08 MED ORDER — OXYCODONE-ACETAMINOPHEN 5-325 MG PO TABS: 1.0000 | ORAL_TABLET | ORAL | Status: DC | PRN

## 2013-03-08 MED ORDER — SILVER SULFADIAZINE 1 % EX CREA: TOPICAL_CREAM | Freq: Every day | CUTANEOUS | Status: AC

## 2013-03-08 MED ORDER — SILVER SULFADIAZINE 1 % EX CREA: TOPICAL_CREAM | Freq: Two times a day (BID) | CUTANEOUS | Status: DC

## 2013-03-08 MED ORDER — OXYCODONE-ACETAMINOPHEN 5-325 MG PO TABS
2.0000 | ORAL_TABLET | Freq: Once | ORAL | Status: AC
  Administered 2013-03-08: 2 via ORAL
  Filled 2013-03-08: qty 2

## 2013-03-08 MED ORDER — SILVER SULFADIAZINE 1 % EX CREA
TOPICAL_CREAM | Freq: Once | CUTANEOUS | Status: AC
  Administered 2013-03-08: 17:00:00 via TOPICAL
  Filled 2013-03-08: qty 85

## 2013-03-08 NOTE — ED Notes (Signed)
MD at bedside. 
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
## 2014-10-11 ENCOUNTER — Emergency Department (HOSPITAL_COMMUNITY)
Admission: EM | Admit: 2014-10-11 | Discharge: 2014-10-11 | Disposition: A | Discharge status: Home / Self Care | Payer: No Typology Code available for payment source | Attending: Emergency Medicine | Admitting: Emergency Medicine

## 2014-10-11 ENCOUNTER — Encounter (HOSPITAL_COMMUNITY): Payer: Self-pay | Admitting: Emergency Medicine

## 2014-10-11 DIAGNOSIS — Z79899 Other long term (current) drug therapy: Secondary | ICD-10-CM | POA: Insufficient documentation

## 2014-10-11 DIAGNOSIS — Y998 Other external cause status: Secondary | ICD-10-CM | POA: Diagnosis not present

## 2014-10-11 DIAGNOSIS — I10 Essential (primary) hypertension: Secondary | ICD-10-CM | POA: Insufficient documentation

## 2014-10-11 DIAGNOSIS — I2510 Atherosclerotic heart disease of native coronary artery without angina pectoris: Secondary | ICD-10-CM | POA: Diagnosis not present

## 2014-10-11 DIAGNOSIS — Z8639 Personal history of other endocrine, nutritional and metabolic disease: Secondary | ICD-10-CM | POA: Diagnosis not present

## 2014-10-11 DIAGNOSIS — Z8719 Personal history of other diseases of the digestive system: Secondary | ICD-10-CM | POA: Diagnosis not present

## 2014-10-11 DIAGNOSIS — S199XXA Unspecified injury of neck, initial encounter: Secondary | ICD-10-CM | POA: Diagnosis not present

## 2014-10-11 DIAGNOSIS — G629 Polyneuropathy, unspecified: Secondary | ICD-10-CM | POA: Diagnosis not present

## 2014-10-11 DIAGNOSIS — Z792 Long term (current) use of antibiotics: Secondary | ICD-10-CM | POA: Diagnosis not present

## 2014-10-11 DIAGNOSIS — Z880 Allergy status to penicillin: Secondary | ICD-10-CM | POA: Diagnosis not present

## 2014-10-11 DIAGNOSIS — Z87891 Personal history of nicotine dependence: Secondary | ICD-10-CM | POA: Diagnosis not present

## 2014-10-11 DIAGNOSIS — Y9389 Activity, other specified: Secondary | ICD-10-CM | POA: Diagnosis not present

## 2014-10-11 DIAGNOSIS — Z951 Presence of aortocoronary bypass graft: Secondary | ICD-10-CM | POA: Insufficient documentation

## 2014-10-11 DIAGNOSIS — Z7982 Long term (current) use of aspirin: Secondary | ICD-10-CM | POA: Insufficient documentation

## 2014-10-11 DIAGNOSIS — Y92410 Unspecified street and highway as the place of occurrence of the external cause: Secondary | ICD-10-CM | POA: Insufficient documentation

## 2014-10-11 DIAGNOSIS — S30810A Abrasion of lower back and pelvis, initial encounter: Secondary | ICD-10-CM | POA: Diagnosis present

## 2014-10-11 MED ORDER — IBUPROFEN 800 MG PO TABS: 800.0000 mg | ORAL_TABLET | Freq: Three times a day (TID) | ORAL | Status: AC | PRN

## 2014-10-11 MED ORDER — METHOCARBAMOL 500 MG PO TABS: 500.0000 mg | ORAL_TABLET | Freq: Two times a day (BID) | ORAL | Status: AC
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
## 2014-10-11 NOTE — Discharge Instructions (Signed)

## 2014-10-11 NOTE — ED Notes (Addendum)
Pt stepped out to the waiting room while waiting for wife to get back from radiology.

## 2014-10-11 NOTE — ED Notes (Signed)
c-collar applied in triage

## 2020-03-27 ENCOUNTER — Other Ambulatory Visit: Payer: Self-pay

## 2020-03-27 ENCOUNTER — Emergency Department
Admission: EM | Admit: 2020-03-27 | Discharge: 2020-03-27 | Disposition: A | Discharge status: Home / Self Care | Payer: Self-pay | Source: Home / Self Care | Attending: Family Medicine | Admitting: Family Medicine

## 2020-03-27 DIAGNOSIS — L03116 Cellulitis of left lower limb: Secondary | ICD-10-CM

## 2020-03-27 MED ORDER — CLINDAMYCIN HCL 300 MG PO CAPS: ORAL_CAPSULE | ORAL | 0 refills | Status: AC

## 2020-03-27 MED ORDER — HYDROCODONE-ACETAMINOPHEN 5-325 MG PO TABS: 1.0000 | ORAL_TABLET | Freq: Four times a day (QID) | ORAL | 0 refills | Status: AC | PRN

## 2020-03-27 NOTE — ED Provider Notes (Signed)
[facility]    CSN: [id number] Arrival date & time: 03/27/20  1504      History   Chief Complaint Chief Complaint  Patient presents with  . Foot Pain    HPI [name] [name] is a 54 y.o. male.   Patient stepped on a nail with his left foot 2.5 weeks ago.  The nail penetrated his shoe and his foot near the mid-metatarsals.  He went to another urgent care at that time where x-rays were negative and he was started on doxycycline (he has one dose left).  He complains of persistent redness/tenderness on the dorsum of his foot.  There has been no drainage/tenderness at the site of penetration.  He feels well otherwise and denies fevers, chills, and sweats.  The history is provided by the patient.    Past Medical History:  Diagnosis Date  . CAD (coronary artery disease)   . HLD (hyperlipidemia)   . HTN (hypertension)   . Pancreatitis   . Peripheral neuropathy     Patient Active Problem List   Diagnosis Date Noted  . HYPERLIPIDEMIA-MIXED 04/24/2008  . HYPERTENSION, BENIGN 04/24/2008  . CAD, NATIVE VESSEL 04/24/2008    Past Surgical History:  Procedure Laterality Date  . CORONARY ARTERY BYPASS GRAFT  Sept 2008   left internal mammary artery to left anterior descending artery, free right internal mammary artery to obtuse marg #1, saphenous vein graft to 1st diagonal, saphen vein graft sequentially to acute marg and posterior descending   . LAMINECTOMY  1992   L5-S1       Home Medications    Prior to Admission medications   Medication Sig Start Date End Date Taking? Authorizing Provider  ibuprofen (ADVIL,MOTRIN) 800 MG tablet Take 1 tablet (800 mg total) by mouth every 8 (eight) hours as needed for moderate pain. 10/11/14  Yes Fayrene Helper, PA-C  aspirin 81 MG tablet Take 162 mg by mouth daily.     [provider]  clindamycin (CLEOCIN) 300 MG capsule Take one cap PO every 8 hours 03/27/20   Lattie Haw, MD  HYDROcodone-acetaminophen (NORCO/VICODIN)  5-325 MG tablet Take 1 tablet by mouth every 6 (six) hours as needed for moderate pain or severe pain. 03/27/20   Lattie Haw, MD  methocarbamol (ROBAXIN) 500 MG tablet Take 1 tablet (500 mg total) by mouth 2 (two) times daily. 10/11/14   Fayrene Helper, PA-C  multivitamin [facility]) per tablet Take 1 tablet by mouth daily.      [provider]  Omega-3 Fatty Acids (FISH OIL) 1000 MG CAPS Take by mouth 2 (two) times daily.      [provider]  silver sulfADIAZINE (SILVADENE) 1 % cream Apply topically daily. Patient not taking: Reported on 10/11/2014 03/08/13   Margarita Grizzle, MD    Family History Family History  Problem Relation Age of Onset  . Coronary artery disease Father 37    Social History Social History   Tobacco Use  . Smoking status: Former Games developer  . Smokeless tobacco: Never Used  Substance Use Topics  . Alcohol use: No  . Drug use: No     Allergies   Penicillins   Review of Systems Review of Systems  Constitutional: Negative for activity change, appetite change, chills, diaphoresis, fatigue and fever.  HENT: Negative.   Eyes: Negative.   Respiratory: Negative.   Cardiovascular: Negative.   Gastrointestinal: Negative.   Genitourinary: Negative.   Skin: Positive for color change.  Neurological: Negative.  Physical Exam Triage Vital Signs ED Triage Vitals  Enc Vitals Group     BP 03/27/20 1535 (!) 174/91     Pulse Rate 03/27/20 1535 88     Resp 03/27/20 1535 16     Temp 03/27/20 1535 98.7 F (37.1 C)     Temp Source 03/27/20 1535 Oral     SpO2 03/27/20 1535 98 %     Weight --      Height --      Head Circumference --      Peak Flow --      Pain Score 03/27/20 1530 7     Pain Loc --      Pain Edu? --      Excl. in GC? --    No data found.  Updated Vital Signs BP (!) 174/91 (BP Location: Right Arm)   Pulse 88   Temp 98.7 F (37.1 C) (Oral)   Resp 16   SpO2 98%   Visual Acuity Right Eye Distance:   Left Eye Distance:    Bilateral Distance:    Right Eye Near:   Left Eye Near:    Bilateral Near:     Physical Exam Vitals and nursing note reviewed.  Constitutional:      General: He is not in acute distress. HENT:     Head: Normocephalic.     Nose: Nose normal.  Eyes:     Pupils: Pupils are equal, round, and reactive to light.  Cardiovascular:     Rate and Rhythm: Normal rate.  Pulmonary:     Effort: Pulmonary effort is normal.  Musculoskeletal:     Cervical back: Neck supple.     Right lower leg: No edema.     Left lower leg: No edema.     Left foot: Normal range of motion and normal capillary refill. Tenderness present. No swelling.       Feet:     Comments: Dorsum of left foot has erythema and tenderness to palpation as noted on diagram.  There is no swelling, induration, or fluctuance.  Distal neurovascular function is intact.   Lymphadenopathy:     Cervical: No cervical adenopathy.  Skin:    General: Skin is warm and dry.  Neurological:     Mental Status: He is alert.      UC Treatments / Results  Labs (all labs ordered are listed, but only abnormal results are displayed) Labs Reviewed - No data to display  EKG   Radiology No results found.  Procedures Procedures (including critical care time)  Medications Ordered in UC Medications - No data to display  Initial Impression / Assessment and Plan / UC Course  I have reviewed the triage vital signs and the nursing notes.  Pertinent labs & imaging results that were available during my care of the patient were reviewed by me and considered in my medical decision making (see chart for details).    Begin clindamycin.  Rx for Lortab (#12, no refill). Controlled Substance Prescriptions I have consulted the [place] Controlled Substances Registry for this patient, and feel the risk/benefit ratio today is favorable for proceeding with this prescription for a controlled substance.   Followup with Family Doctor if not improved in one  week.    Final Clinical Impressions(s) / UC Diagnoses   Final diagnoses:  Cellulitis of left foot     Discharge Instructions     Continue warm soak 2 to 3 times daily.  Elevate leg whenever possible.  If symptoms become significantly worse during the night or over the weekend, proceed to the local emergency room.     ED Prescriptions    Medication Sig Dispense Auth. Provider   clindamycin (CLEOCIN) 300 MG capsule Take one cap PO every 8 hours 30 capsule Lattie Haw, MD   HYDROcodone-acetaminophen (NORCO/VICODIN) 5-325 MG tablet Take 1 tablet by mouth every 6 (six) hours as needed for moderate pain or severe pain. 12 tablet Lattie Haw, MD        Lattie Haw, MD 03/30/20 [phone]

## 2020-03-27 NOTE — ED Triage Notes (Signed)
Patient presents to Urgent Care with complaints of left foot pain since stepping on a nail about 2 weeks ago. Patient reports he went to another UC at the time, had x-rays. Was put on doxycycline (1 dose left), and still has redness on the top of his foot and has a lot of pain. Has been taking advil around the clock.

## 2020-03-27 NOTE — Discharge Instructions (Signed)
Continue warm soak 2 to 3 times daily.  Elevate leg whenever possible.  If symptoms become significantly worse during the night or over the weekend, proceed to the local emergency room.

## 2024-08-25 ENCOUNTER — Encounter: Payer: Self-pay | Admitting: Emergency Medicine

## 2024-08-25 ENCOUNTER — Ambulatory Visit
Admission: EM | Admit: 2024-08-25 | Discharge: 2024-08-25 | Disposition: A | Discharge status: Home / Self Care | Payer: Self-pay | Source: Home / Self Care

## 2024-08-25 DIAGNOSIS — L300 Nummular dermatitis: Secondary | ICD-10-CM

## 2024-08-25 MED ORDER — TRIAMCINOLONE ACETONIDE 0.1 % EX OINT: TOPICAL_OINTMENT | CUTANEOUS | 0 refills | Status: AC

## 2024-08-25 NOTE — ED Provider Notes (Signed)
 " [name] [name]    CSN: [id number] Arrival date & time: 08/25/24  1126      History   Chief Complaint Chief Complaint  Patient presents with   Rash    HPI [name] [name] is a 59 y.o. male.   Patient presents with a rash to the lower extremities for the past 3-4 weeks. Rash initially began as a small circular area on the left lower leg and knee and has since progressed to involve the right leg. Patient reports the area on the right leg has worsened after applying his granddaughter's prescribed mupirocin ointment. Lesions are occasionally pruritic but without pain, burning, or tenderness. Denies drainage, fever, chills, or other systemic symptoms. No new soaps, lotions, medications, or environmental exposures. No known contacts with similar symptoms. No prior history of similar rash.  The following sections of the patient's history were reviewed and updated as appropriate: allergies, current medications, past family history, past medical history, past social history, past surgical history, and problem list.     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 Authorizing Provider  multivitamin (THERAGRAN) per tablet Take 1 tablet by mouth daily.      Yes [provider]  Omega-3 Fatty Acids (FISH OIL) 1000 MG CAPS Take by mouth 2 (two) times daily.     Yes [provider]  triamcinolone  ointment (KENALOG ) 0.1 % Apply a thin layer to affected areas twice daily for up to 14 days. Discontinue if no improvement after 2 weeks. 08/25/24  Yes Iola Lukes, FNP  aspirin 81 MG tablet Take 162 mg by mouth daily.     [provider]  clindamycin  (CLEOCIN ) 300 MG capsule Take one cap PO every 8 hours 03/27/20   Pauline Garnette LABOR, MD  HYDROcodone -acetaminophen  (NORCO/VICODIN) 5-325 MG tablet Take 1 tablet by mouth every 6 (six) hours as needed for moderate pain or severe pain. 03/27/20   Pauline Garnette LABOR, MD  ibuprofen  (ADVIL ,MOTRIN ) 800 MG tablet Take 1 tablet (800 mg total) by mouth every 8 (eight) hours as needed for moderate pain. 10/11/14   Nivia Colon, PA-C  methocarbamol  (ROBAXIN ) 500 MG tablet Take 1 tablet (500 mg total) by mouth 2 (two) times daily. 10/11/14   Nivia Colon, PA-C  silver  sulfADIAZINE  (SILVADENE ) 1 % cream Apply topically daily. Patient not taking: Reported on 10/11/2014 03/08/13   Levander Houston, MD    Family History Family History  Problem Relation Age of Onset   Coronary artery disease Father 56    Social History Social History[1]   Allergies   Penicillins   Review of Systems Review of Systems  Constitutional:  Negative for fever.  Skin:  Positive for rash.  All other systems reviewed and are negative.    Physical Exam Triage Vital Signs ED Triage Vitals  Encounter Vitals Group     BP 08/25/24 1240 (!) 179/80     Girls Systolic BP Percentile --      Girls Diastolic BP Percentile --      Boys Systolic BP Percentile --      Boys Diastolic BP Percentile --      Pulse Rate 08/25/24 1240 79     Resp 08/25/24 1240 18     Temp 08/25/24 1240 98 F (36.7 C)     Temp Source 08/25/24 1240 Oral     SpO2 08/25/24 1240 98 %     Weight 08/25/24 1239 225 lb (102.1 kg)     Height 08/25/24 1239 5'  11 (1.803 m)     Head Circumference --      Peak Flow --      Pain Score 08/25/24 1239 0     Pain Loc --      Pain Education --      Exclude from Growth Chart --    No data found.  Updated Vital Signs BP (!) 179/80 (BP Location: Right Arm)   Pulse 79   Temp 98 F (36.7 C) (Oral)   Resp 18   Ht 5' 11 (1.803 m)   Wt 225 lb (102.1 kg)   SpO2 98%   BMI 31.38 kg/m   Visual Acuity Right Eye Distance:   Left Eye Distance:   Bilateral Distance:    Right Eye Near:   Left Eye Near:    Bilateral Near:     Physical Exam Vitals reviewed.  Constitutional:      General: He is awake. He is not in acute distress.    Appearance: Normal appearance. He is well-developed. He is not ill-appearing, toxic-appearing or diaphoretic.  HENT:     Head: Normocephalic.     Right Ear: Hearing normal.     Left Ear: Hearing normal.     Nose: Nose normal.     Mouth/Throat:     Mouth: Mucous membranes are moist.  Eyes:     General: Vision grossly intact.     Conjunctiva/sclera: Conjunctivae normal.  Cardiovascular:     Rate and Rhythm: Normal rate and regular rhythm.     Heart sounds: Normal heart sounds.  Pulmonary:     Effort: Pulmonary effort is normal.     Breath sounds: Normal breath sounds and air entry.  Musculoskeletal:        General: Normal range of motion.     Cervical back: Normal range of motion and neck supple.  Skin:    General: Skin is warm and dry.     Comments: Well-demarcated erythematous scaly plaques noted to the anterior aspect of the bilateral lower extremities, some with superficial crusting and excoriation. Lesions are without tenderness, warmth, fluctuance, or purulent drainage. No swelling or surrounding erythema.   Neurological:     General: No focal deficit present.     Mental Status: He is alert and oriented to person, place, and time.  Psychiatric:        Speech: Speech normal.        Behavior: Behavior is cooperative.       Anterior aspect of right  lower leg     Anterior aspect of left knee and leg   UC Treatments / Results  Labs (all labs ordered are listed, but only abnormal results are displayed) Labs Reviewed -  No data to display  EKG   Radiology No results found.  Procedures Procedures (including critical [name] time)  Medications Ordered in UC Medications - No data to display  Initial Impression / Assessment and Plan / UC Course  I have reviewed the triage vital signs and the nursing notes.  Pertinent labs & imaging results that were available during my [name] of the patient were reviewed by me and considered in my medical decision making (see chart for details).     Patient presents with several weeks of pruritic scaly plaques to the lower extremities without pain, drainage, warmth, or systemic symptoms. Exam findings are most consistent with inflammatory dermatitis, with low concern for infection at this time. Patient advised to stop the mupirocin and was prescribed triamcinolone  ointment to apply twice daily for up to two weeks and advised on skin hydration and avoidance of irritants. Dermatology follow-up recommended if no improvement after two weeks. Patient instructed to follow up with PCP or return to urgent [name] if symptoms worsen or signs of infection develop.  Today's evaluation has revealed no signs of a dangerous process. Discussed diagnosis with patient and/or guardian. Patient and/or guardian aware of their diagnosis, possible red flag symptoms to watch out for and need for close follow up. Patient and/or guardian understands verbal and written discharge instructions. Patient and/or guardian comfortable with plan and disposition.  Patient and/or guardian has a clear mental status at this time, good insight into illness (after discussion and teaching) and has clear judgment to make decisions regarding their [name]  Documentation was completed with the aid of voice recognition software. Transcription may contain  typographical errors.  Final Clinical Impressions(s) / UC Diagnoses   Final diagnoses:  Nummular eczematous dermatitis     Discharge Instructions      You were seen today for itchy, scaly patches on your legs that are most consistent with an inflammatory skin condition such as nummular eczema. There are no signs of infection at this time. You were prescribed triamcinolone  ointment to apply in a thin layer to the affected areas twice daily for up to two weeks. You can also help soothe your skin by using a thick moisturizer such as Aquaphor, CeraVe, or Eucerin several times a day, especially after bathing, and by avoiding hot showers, scented soaps, and scratching the areas. Taking an antihistamine like cetirizine or diphenhydramine may help with itching if needed.  Follow up with your primary [name] provider or a dermatologist if the rash is not improving after two weeks of treatment, continues to spread, or keeps returning. Return to urgent [name] or go to the emergency department right away if you develop fever, rapidly worsening redness, increasing pain, swelling, warmth, pus or drainage, red streaks, or if the area becomes very tender, as these may be signs of infection.     ED Prescriptions     Medication Sig Dispense Auth. Provider   triamcinolone  ointment (KENALOG ) 0.1 % Apply a thin layer to affected areas twice daily for up to 14 days. Discontinue if no improvement after 2 weeks. 30 g Iola Lukes, FNP      PDMP not reviewed this encounter.     [1]  Social History Tobacco Use   Smoking status: Former   Smokeless tobacco: Never  Vaping Use   Vaping status: Never Used  Substance Use Topics   Alcohol use: No   Drug use: No     Iola Lukes, FNP 08/25/24 1438  "

## 2024-08-25 NOTE — Discharge Instructions (Addendum)
 You were seen today for itchy, scaly patches on your legs that are most consistent with an inflammatory skin condition such as nummular eczema. There are no signs of infection at this time. You were prescribed triamcinolone  ointment to apply in a thin layer to the affected areas twice daily for up to two weeks. You can also help soothe your skin by using a thick moisturizer such as Aquaphor, CeraVe, or Eucerin several times a day, especially after bathing, and by avoiding hot showers, scented soaps, and scratching the areas. Taking an antihistamine like cetirizine or diphenhydramine may help with itching if needed.  Follow up with your primary care provider or a dermatologist if the rash is not improving after two weeks of treatment, continues to spread, or keeps returning. Return to urgent care or go to the emergency department right away if you develop fever, rapidly worsening redness, increasing pain, swelling, warmth, pus or drainage, red streaks, or if the area becomes very tender, as these may be signs of infection.

## 2024-08-25 NOTE — ED Triage Notes (Signed)
 Patient c/o red circular spot on his lower leg x 2 weeks.  The area has grown in size and a smaller one on the left leg.  Patient denies any burning or itching.  Tried Mupirocin on the area w/o much relief.
